# Patient Record
Sex: Female | Born: 1960 | Race: White | Hispanic: No | Marital: Married | Smoking: Never smoker
Health system: Southern US, Community
[De-identification: ages and names within clinical notes are randomized; demographics above are authoritative.]

## PROBLEM LIST (undated history)

## (undated) DIAGNOSIS — I10 Essential (primary) hypertension: Secondary | ICD-10-CM

## (undated) DIAGNOSIS — T7840XA Allergy, unspecified, initial encounter: Secondary | ICD-10-CM

## (undated) HISTORY — DX: Allergy, unspecified, initial encounter: T78.40XA

## (undated) HISTORY — DX: Essential (primary) hypertension: I10

---
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
## 1992-05-27 HISTORY — PX: NOSE SURGERY: SHX723

## 1992-05-27 HISTORY — PX: KNEE SURGERY: SHX244

## 2004-06-27 ENCOUNTER — Encounter: Payer: Self-pay | Admitting: Family Medicine

## 2005-06-11 ENCOUNTER — Ambulatory Visit: Payer: Self-pay | Admitting: Family Medicine

## 2005-12-11 ENCOUNTER — Ambulatory Visit: Payer: Self-pay | Admitting: Family Medicine

## 2006-01-07 ENCOUNTER — Ambulatory Visit: Payer: Self-pay | Admitting: Family Medicine

## 2006-07-16 ENCOUNTER — Ambulatory Visit: Payer: Self-pay | Admitting: Family Medicine

## 2006-07-16 ENCOUNTER — Encounter: Payer: Self-pay | Admitting: Family Medicine

## 2008-09-19 ENCOUNTER — Encounter: Payer: Self-pay | Admitting: Family Medicine

## 2009-04-26 ENCOUNTER — Ambulatory Visit: Payer: Self-pay | Admitting: Family Medicine

## 2009-04-26 DIAGNOSIS — I10 Essential (primary) hypertension: Secondary | ICD-10-CM | POA: Insufficient documentation

## 2009-05-04 ENCOUNTER — Encounter: Payer: Self-pay | Admitting: Family Medicine

## 2009-05-04 LAB — CONVERTED CEMR LAB
ALT: 18 units/L (ref 0–35)
AST: 17 units/L (ref 0–37)
Albumin: 4.6 g/dL (ref 3.5–5.2)
CO2: 25 meq/L (ref 19–32)
Calcium: 9.4 mg/dL (ref 8.4–10.5)
LDL Cholesterol: 172 mg/dL — ABNORMAL HIGH (ref 0–99)
Total Bilirubin: 0.6 mg/dL (ref 0.3–1.2)
Total Protein: 7.4 g/dL (ref 6.0–8.3)
Triglycerides: 134 mg/dL (ref <150)
VLDL: 27 mg/dL (ref 0–40)

## 2009-06-01 ENCOUNTER — Ambulatory Visit: Payer: Self-pay | Admitting: Family Medicine

## 2009-06-01 DIAGNOSIS — E785 Hyperlipidemia, unspecified: Secondary | ICD-10-CM | POA: Insufficient documentation

## 2009-06-26 ENCOUNTER — Telehealth: Payer: Self-pay | Admitting: Family Medicine

## 2009-07-21 ENCOUNTER — Encounter: Payer: Self-pay | Admitting: Family Medicine

## 2009-07-23 LAB — CONVERTED CEMR LAB
ALT: 13 units/L (ref 0–35)
AST: 14 units/L (ref 0–37)
CO2: 26 meq/L (ref 19–32)
Calcium: 9.3 mg/dL (ref 8.4–10.5)
Glucose, Bld: 100 mg/dL — ABNORMAL HIGH (ref 70–99)
HDL: 50 mg/dL (ref >39)
Potassium: 4.4 meq/L (ref 3.5–5.3)
Sodium: 140 meq/L (ref 135–145)
Total CHOL/HDL Ratio: 2.7
Triglycerides: 84 mg/dL (ref <150)

## 2009-08-01 ENCOUNTER — Ambulatory Visit: Payer: Self-pay | Admitting: Family Medicine

## 2009-12-12 ENCOUNTER — Telehealth: Payer: Self-pay | Admitting: Family

## 2010-01-22 ENCOUNTER — Ambulatory Visit: Payer: Self-pay | Admitting: Family Medicine

## 2010-01-22 DIAGNOSIS — I10 Essential (primary) hypertension: Secondary | ICD-10-CM | POA: Insufficient documentation

## 2010-01-22 DIAGNOSIS — S8010XA Contusion of unspecified lower leg, initial encounter: Secondary | ICD-10-CM | POA: Insufficient documentation

## 2010-01-25 ENCOUNTER — Telehealth (INDEPENDENT_AMBULATORY_CARE_PROVIDER_SITE_OTHER): Payer: Self-pay | Admitting: *Deleted

## 2010-06-28 NOTE — Assessment & Plan Note (Signed)
Summary: 2 MONTH FU HTN, lipids   Vital Signs:  Patient profile:   50 year old female Height:      67 inches Weight:      197 pounds Pulse rate:   96 / minute BP sitting:   131 / 77  (left arm) Cuff size:   regular  Vitals Entered By: Kathlene November (August 01, 2009 10:02 AM) CC: followup BP. Stopped the cholesterol med Simvastatin- due to muscle aches, Hypertension Management   Primary Care Provider:  Nani Gasser MD  CC:  followup BP. Stopped the cholesterol med Simvastatin- due to muscle aches and Hypertension Management.  History of Present Illness: followup BP. Stopped the cholesterol med Simvastatin- due to muscle aches.  Went off for a week and muscle aches got better and then restarted it and sxs returned. Was having pain in her shoulders and hips.  Also felt fatigued on it.   Hypertension History:      She denies headache, chest pain, palpitations, dyspnea with exertion, orthopnea, PND, peripheral edema, visual symptoms, neurologic problems, syncope, and side effects from treatment.  She notes no problems with any antihypertensive medication side effects.        Positive major cardiovascular risk factors include hyperlipidemia and hypertension.  Negative major cardiovascular risk factors include female age less than 38 years old and non-tobacco-user status.     Current Medications (verified): 1)  Calcium and Vitamin D .... Take One Tablet Two Times A Day 2)  Loratadine 10 Mg Tabs (Loratadine) .... Take One Tablet By Mouth Once A Day 3)  Fluticasone Propionate 50 Mcg/act Susp (Fluticasone Propionate) .Marland Kitchen.. 1-2 Sprays in Each Nostril Daily For Allergies 4)  Vitamin C .... Take One Tablet Bby Mouth Daily 5)  Lisinopril-Hydrochlorothiazide 10-12.5 Mg Tabs (Lisinopril-Hydrochlorothiazide) .... Take 1 Tablet By Mouth Once A Day  Allergies (verified): 1)  ! Simvastatin (Simvastatin)  Comments:  Nurse/Medical Assistant: The patient's medications and allergies were reviewed  with the patient and were updated in the Medication and Allergy Lists. Kathlene November (August 01, 2009 10:03 AM)  Physical Exam  General:  Well-developed,well-nourished,in no acute distress; alert,appropriate and cooperative throughout examination Head:  Normocephalic and atraumatic without obvious abnormalities. No apparent alopecia or balding. Lungs:  Normal respiratory effort, chest expands symmetrically. Lungs are clear to auscultation, no crackles or wheezes. Heart:  Normal rate and regular rhythm. S1 and S2 normal without gallop, murmur, click, rub or other extra sounds. Skin:  no rashes.   Cervical Nodes:  No lymphadenopathy noted Psych:  Cognition and judgment appear intact. Alert and cooperative with normal attention span and concentration. No apparent delusions, illusions, hallucinations   Impression & Recommendations:  Problem # 1:  HYPERTENSION, BENIGN (ICD-401.1)  Her updated medication list for this problem includes:    Lisinopril-hydrochlorothiazide 10-12.5 Mg Tabs (Lisinopril-hydrochlorothiazide) .Marland Kitchen... Take 1 tablet by mouth once a day Looks great! Doing well. F/U in 6 month.  BP today: 131/77 Prior BP: 125/84 (06/01/2009)  Prior 10 Yr Risk Heart Disease: 6 % (06/01/2009)  Labs Reviewed: K+: 4.4 (07/21/2009) Creat: : 0.70 (07/21/2009)   Chol: 134 (07/21/2009)   HDL: 50 (07/21/2009)   LDL: 67 (07/21/2009)   TG: 84 (07/21/2009)  Problem # 2:  HYPERLIPIDEMIA (ICD-272.4) Samples of 20mg  given. Asked her to cut them in half for a months. Call me if not tolerating well. REcheck level in 8 weeks on new med if tolerating well.  Will try changing her to lipitor since tends to have fewer myalgias.  Her updated medication list for this problem includes:    Lipitor 20 Mg Tabs (Atorvastatin calcium) .Marland Kitchen... Take 1 tablet by mouth once a day at bedtime  Labs Reviewed: SGOT: 14 (07/21/2009)   SGPT: 13 (07/21/2009)  Prior 10 Yr Risk Heart Disease: 6 % (06/01/2009)   HDL:50  (07/21/2009), 53 (47/82/9562)  LDL:67 (07/21/2009), 172 (13/12/6576)  Chol:134 (07/21/2009), 252 (05/04/2009)  Trig:84 (07/21/2009), 134 (05/04/2009)  Complete Medication List: 1)  Calcium and Vitamin D  .... Take one tablet two times a day 2)  Loratadine 10 Mg Tabs (Loratadine) .... Take one tablet by mouth once a day 3)  Fluticasone Propionate 50 Mcg/act Susp (Fluticasone propionate) .Marland Kitchen.. 1-2 sprays in each nostril daily for allergies 4)  Vitamin C  .... Take one tablet bby mouth daily 5)  Lisinopril-hydrochlorothiazide 10-12.5 Mg Tabs (Lisinopril-hydrochlorothiazide) .... Take 1 tablet by mouth once a day 6)  Lipitor 20 Mg Tabs (Atorvastatin calcium) .... Take 1 tablet by mouth once a day at bedtime  Hypertension Assessment/Plan:      The patient's hypertensive risk group is category B: At least one risk factor (excluding diabetes) with no target organ damage.  Her calculated 10 year risk of coronary heart disease is 3 %.  Today's blood pressure is 131/77.  Her blood pressure goal is < 140/90.

## 2010-06-28 NOTE — Progress Notes (Signed)
Summary: Stopping Lipitor  Phone Note Call from Patient Call back at Home Phone [phone]   Caller: Patient Call For: Sandford Craze Summary of Call: Pt states that on the Lipitor and having jointaches just like she did on the Simvastatin and is going to stop it and wanted you to be aware Initial call taken by: Kathlene November,  December 12, 2009 1:33 PM  Follow-up for Phone Call        [facility] to stop lipitor, but pt needs to arrange follow up with Dr. Linford Arnold in the next month. Follow-up by: Lemont Fillers FNP,  December 12, 2009 4:44 PM  Additional Follow-up for Phone Call Additional follow up Details #1::        Pt notified of results and MD instructions. KJ LPN Additional Follow-up by: Kathlene November,  December 12, 2009 4:56 PM

## 2010-06-28 NOTE — Progress Notes (Signed)
  Phone Note Outgoing Call   Call placed by: Lajean Saver RN,  January 25, 2010 1:58 PM Call placed to: Patient Action Taken: Phone Call Completed Summary of Call: Call back: Patient says her ankle is improving. She did not have any further questions and was told to call uswith any questions or concerns.

## 2010-06-28 NOTE — Progress Notes (Signed)
Summary: joint aches  Phone Note Call from Patient Call back at [facility] Phone [phone]   Caller: Patient Summary of Call: Put on Simvastatin and Lisinopril. having joint pains . Will watch this over next few days Initial call taken by: Kathlene November,  June 26, 2009 11:49 AM  Follow-up for Phone Call        Hold the simvastain for one week and see if better.  IF not let me know.  If is better then lets retry the simvastating after a weeka dn see if starts again. If does then will need to change her statin dose.  Follow-up by: Nani Gasser MD,  June 26, 2009 12:00 PM  Additional Follow-up for Phone Call Additional follow up Details #1::        Pt notified of MD instructions. KJ LPN Additional Follow-up by: Kathlene November,  June 26, 2009 12:42 PM

## 2010-06-28 NOTE — Assessment & Plan Note (Signed)
Summary: LEFT ANKLE PAIN (4)   Vital Signs:  Patient Profile:   50 Years Old Female CC:       left ankle pain post injury 2 weeks ago Height:     67 inches (170.18 cm) Weight:      204 pounds O2 Sat:      100 % O2 treatment:    Room Air Temp:     98.5 degrees F oral Pulse rate:   74 / minute Resp:     12 per minute BP sitting:   128 / 85  (left arm) Cuff size:   regular  Pt. in pain?   yes    Location:   left ankle    Intensity:   5    Type:       sharp  Vitals Entered By: Lajean Saver RN (January 22, 2010 9:29 AM)                   Updated Prior Medication List: * CALCIUM AND VITAMIN D take one tablet two times a day FLUTICASONE PROPIONATE 50 MCG/ACT SUSP (FLUTICASONE PROPIONATE) 1-2 sprays in each nostril daily for allergies * VITAMIN C take one tablet bby mouth daily LISINOPRIL-HYDROCHLOROTHIAZIDE 10-12.5 MG TABS (LISINOPRIL-HYDROCHLOROTHIAZIDE) Take 1 tablet by mouth once a day  Current Allergies (reviewed today): ! SIMVASTATIN (SIMVASTATIN)History of Present Illness Chief Complaint:  left ankle pain post injury 2 weeks ago History of Present Illness:  Subjective:  Patient complains of softball hitting left lower leg two weeks ago.  Although improving, still has tenderness and mild swelling left lower leg.  REVIEW OF SYSTEMS Constitutional Symptoms      Denies fever, chills, night sweats, weight loss, weight gain, and fatigue.  Eyes       Denies change in vision, eye pain, eye discharge, glasses, contact lenses, and eye surgery. Ear/Nose/Throat/Mouth       Denies hearing loss/aids, change in hearing, ear pain, ear discharge, dizziness, frequent runny nose, frequent nose bleeds, sinus problems, sore throat, hoarseness, and tooth pain or bleeding.  Respiratory       Denies dry cough, productive cough, wheezing, shortness of breath, asthma, bronchitis, and emphysema/COPD.  Cardiovascular       Denies murmurs, chest pain, and tires easily with exhertion.     Gastrointestinal       Denies stomach pain, nausea/vomiting, diarrhea, constipation, blood in bowel movements, and indigestion. Genitourniary       Denies painful urination, kidney stones, and loss of urinary control. Neurological       Complains of numbness.      Denies paralysis, seizures, and fainting/blackouts.      Comments: left ankle Musculoskeletal       Complains of joint pain, joint stiffness, and swelling.      Denies muscle pain, decreased range of motion, redness, muscle weakness, and gout.      Comments: left ankle Skin       Denies bruising, unusual mles/lumps or sores, and hair/skin or nail changes.  Psych       Denies mood changes, temper/anger issues, anxiety/stress, speech problems, depression, and sleep problems. Other Comments: Patient was hit by a soft ball 2 weeks ago. She c/o numbness and pain with activity in her left ankle.    Past History:  Past Medical History: G2 P2, Seasonal allergies Hypertension  Past Surgical History: Reviewed history from 03/04/2006 and no changes required. c/sec  12/87, 12/89, knee sx, right  1994, Nose surgery 1994, oral sx  1982  Family History: Reviewed history from 04/26/2009 and no changes required. DM-mother, high chol, HTN GB dz-sister,   skin Ca-sister, stroke-dad Father - Parkinson's syndrome, HTN deceased.   Social History: Reviewed history from 03/04/2006 and no changes required. Psychologist, sport and exercise for [employer].  BA degree.  Married to Mandan with 2 children.  Never smoked, 1-3EtOH pe week, no drugs, 2-3caffeinated drinks per day, works out 1-1 1/2 hr 3-4day/wk.   Objective:  No acute distress  Left lower leg:  Mild swelling and point tenderness lower medial pre-tibial area.  Ankle has full range of motion.  No posterior calf tenderness.  Distal neurovascular intact  X-ray left tib/fib:  negative. Assessment New Problems: CONTUSION, LOWER LEG, LEFT (ICD-924.10) HYPERTENSION (ICD-401.9)   Plan New  Orders: T-DG Tibia/Fibula*L* [73590] New Patient Level III [99203] Planning Comments:   Recommend graduated support hose daytime until healed.  Begin applying heating pad once or twice daily.  Elevate leg when possible. Follow-up with PCP for increasing pain or swelling.   The patient and/or caregiver has been counseled thoroughly with regard to medications prescribed including dosage, schedule, interactions, rationale for use, and possible side effects and they verbalize understanding.  Diagnoses and expected course of recovery discussed and will return if not improved as expected or if the condition worsens. Patient and/or caregiver verbalized understanding.   Orders Added: 1)  T-DG Tibia/Fibula*L* [73590] 2)  New Patient Level III [16109]

## 2010-06-28 NOTE — Assessment & Plan Note (Signed)
Summary: HTN, lipids   Vital Signs:  Patient profile:   50 year old female Height:      67 inches Weight:      197 pounds Pulse rate:   86 / minute BP sitting:   125 / 84  (left arm) Cuff size:   regular  Vitals Entered By: Kathlene November (June 01, 2009 9:10 AM) CC: follow-up BP, Hypertension Management   CC:  follow-up BP and Hypertension Management.  Hypertension History:      Has lost 5 pounds. Did have a couple of dizzy spells when first started the medication. Since then has been fine. Came off birth control pills. Still not exercisign but has lost weight. .        Positive major cardiovascular risk factors include hyperlipidemia and hypertension.  Negative major cardiovascular risk factors include female age less than 51 years old and non-tobacco-user status.     Current Medications (verified): 1)  Calcium and Vitamin D .... Take One Tablet Two Times A Day 2)  Loratadine 10 Mg Tabs (Loratadine) .... Take One Tablet By Mouth Once A Day 3)  Fluticasone Propionate 50 Mcg/act Susp (Fluticasone Propionate) .Marland Kitchen.. 1-2 Sprays in Each Nostril Daily For Allergies 4)  Vitamin C .... Take One Tablet Bby Mouth Daily 5)  Lisinopril-Hydrochlorothiazide 10-12.5 Mg Tabs (Lisinopril-Hydrochlorothiazide) .... Take 1 Tablet By Mouth Once A Day 6)  Simvastatin 40 Mg Tabs (Simvastatin) .... Take 1 Tablet By Mouth Once A Day At Bedtime  Allergies (verified): No Known Drug Allergies  Comments:  Nurse/Medical Assistant: The patient's medications and allergies were reviewed with the patient and were updated in the Medication and Allergy Lists. Kathlene November (June 01, 2009 9:11 AM)  Physical Exam  General:  Well-developed,well-nourished,in no acute distress; alert,appropriate and cooperative throughout examination Lungs:  Normal respiratory effort, chest expands symmetrically. Lungs are clear to auscultation, no crackles or wheezes. Heart:  Normal rate and regular rhythm. S1 and S2 normal  without gallop, murmur, click, rub or other extra sounds.  No carotid bruits.  Skin:  no rashes.   Psych:  Cognition and judgment appear intact. Alert and cooperative with normal attention span and concentration. No apparent delusions, illusions, hallucinations   Impression & Recommendations:  Problem # 1:  HYPERTENSION, BENIGN (ICD-401.1) Doing great!!!! At [facility]. FU in 2 months to recheck. She is doing such a great job on weight loss. Still encourage regular exercise. She would love to come off medication and I think this is a great goal.   Her updated medication list for this problem includes:    Lisinopril-hydrochlorothiazide 10-12.5 Mg Tabs (Lisinopril-hydrochlorothiazide) .Marland Kitchen... Take 1 tablet by mouth once a day  Problem # 2:  HYPERLIPIDEMIA (ICD-272.4)  Had some stomach upset intially but better. Due to recheck later this month. Lab slip given today. Continue tto work on diet adn weight. Gave her a copy of her previsou labwork and reviewed this with her and her goals.  Her updated medication list for this problem includes:    Simvastatin 40 Mg Tabs (Simvastatin) .Marland Kitchen... Take 1 tablet by mouth once a day at bedtime  Orders: T-Lipid Profile ([id number]) T-Comprehensive Metabolic Panel ([id number])  Complete Medication List: 1)  Calcium and Vitamin D  .... Take one tablet two times a day 2)  Loratadine 10 Mg Tabs (Loratadine) .... Take one tablet by mouth once a day 3)  Fluticasone Propionate 50 Mcg/act Susp (Fluticasone propionate) .Marland Kitchen.. 1-2 sprays in each nostril daily for allergies 4)  Vitamin C  .Marland KitchenMarland KitchenMarland Kitchen  Take one tablet bby mouth daily 5)  Lisinopril-hydrochlorothiazide 10-12.5 Mg Tabs (Lisinopril-hydrochlorothiazide) .... Take 1 tablet by mouth once a day 6)  Simvastatin 40 Mg Tabs (Simvastatin) .... Take 1 tablet by mouth once a day at bedtime  Hypertension Assessment/Plan:      The patient's hypertensive risk group is category B: At least one risk factor (excluding diabetes) with  no target organ damage.  Her calculated 10 year risk of coronary heart disease is 6 %.  Today's blood pressure is 125/84.  Her blood pressure goal is < 140/90.  Flu Vaccine Next Due:  Not Indicated

## 2010-08-22 ENCOUNTER — Other Ambulatory Visit: Payer: Self-pay | Admitting: Family Medicine

## 2010-08-23 ENCOUNTER — Other Ambulatory Visit: Payer: Self-pay | Admitting: *Deleted

## 2010-08-23 MED ORDER — LISINOPRIL-HYDROCHLOROTHIAZIDE 10-12.5 MG PO TABS: 1.0000 | ORAL_TABLET | Freq: Every day | ORAL | Status: DC

## 2010-08-26 ENCOUNTER — Encounter: Payer: Self-pay | Admitting: Family Medicine

## 2010-08-30 ENCOUNTER — Ambulatory Visit
Admission: RE | Admit: 2010-08-30 | Discharge: 2010-08-30 | Disposition: A | Discharge status: Home / Self Care | Payer: BC Managed Care – PPO | Source: Ambulatory Visit | Attending: Sports Medicine | Admitting: Sports Medicine

## 2010-08-30 ENCOUNTER — Other Ambulatory Visit: Payer: Self-pay | Admitting: Sports Medicine

## 2010-08-30 ENCOUNTER — Ambulatory Visit (INDEPENDENT_AMBULATORY_CARE_PROVIDER_SITE_OTHER): Payer: BC Managed Care – PPO | Admitting: Family Medicine

## 2010-08-30 ENCOUNTER — Encounter: Payer: Self-pay | Admitting: Family Medicine

## 2010-08-30 VITALS — BP 102/70 | HR 98 | Ht 68.75 in | Wt 209.0 lb

## 2010-08-30 DIAGNOSIS — I10 Essential (primary) hypertension: Secondary | ICD-10-CM

## 2010-08-30 DIAGNOSIS — M25522 Pain in left elbow: Secondary | ICD-10-CM

## 2010-08-30 LAB — LIPID PANEL
Cholesterol: 197 mg/dL (ref 0–200)
HDL: 50 mg/dL (ref >39)
Total CHOL/HDL Ratio: 3.9 Ratio
VLDL: 26 mg/dL (ref 0–40)

## 2010-08-30 MED ORDER — HYDROCHLOROTHIAZIDE 12.5 MG PO CAPS: 12.5000 mg | ORAL_CAPSULE | Freq: Every day | ORAL | Status: DC

## 2010-08-30 NOTE — Progress Notes (Signed)
  Subjective:    Patient ID: Nicole Braun, female    DOB: January 27, 1961, 50 y.o.   MRN: [id number]  Hypertension This is a chronic problem. The problem has been gradually improving since onset. The problem is controlled. Pertinent negatives include no chest pain or shortness of breath. There are no associated agents to hypertension. Risk factors for coronary artery disease include no known risk factors. Past treatments include nothing. The current treatment provides significant improvement. There are no compliance problems.   She says she is eating more healthy and being more active in general. Has really been trying to wALK  For exercise on the weekends.     Review of Systems  Respiratory: Negative for shortness of breath.   Cardiovascular: Negative for chest pain.       Objective:   Physical Exam  Constitutional: She appears well-developed and well-nourished.  HENT:  Head: Normocephalic and atraumatic.  Eyes: Conjunctivae are normal. Pupils are equal, round, and reactive to light.  Cardiovascular: Normal rate, regular rhythm and normal heart sounds.   Pulmonary/Chest: Effort normal and breath sounds normal.  Musculoskeletal: She exhibits no edema.  Skin: Skin is warm and dry.  Psychiatric: She has a normal mood and affect.          Assessment & Plan:

## 2010-08-30 NOTE — Assessment & Plan Note (Signed)
Doing so well will d/c the ACE and continue the diuretic. F/u in 1-2 months and hopefully can discontinue the meds completely. Cointinue her regular exercise and continue to work on diet.

## 2010-08-31 LAB — COMPLETE METABOLIC PANEL WITH GFR
AST: 15 U/L (ref 0–37)
Alkaline Phosphatase: 73 U/L (ref 39–117)
BUN: 13 mg/dL (ref 6–23)
GFR, Est Non African American: >60 mL/min (ref >60)
Glucose, Bld: 99 mg/dL (ref 70–99)
Potassium: 4.2 mEq/L (ref 3.5–5.3)
Sodium: 139 mEq/L (ref 135–145)
Total Bilirubin: 0.5 mg/dL (ref 0.3–1.2)
Total Protein: 6.8 g/dL (ref 6.0–8.3)

## 2010-09-03 ENCOUNTER — Telehealth: Payer: Self-pay | Admitting: Family Medicine

## 2010-09-03 NOTE — Telephone Encounter (Signed)
Call pt: LABs are ok.  LDL chol is higher than when was on the statin, but is much much better than it was a year ago.  Keep up the good work and will hold off on restarting a cholesterol pill. Recheck in 6 months.

## 2010-09-04 NOTE — Telephone Encounter (Signed)
Left message on pt.'s vm.

## 2010-09-10 ENCOUNTER — Ambulatory Visit: Payer: BC Managed Care – PPO | Attending: Sports Medicine | Admitting: Physical Therapy

## 2010-09-10 DIAGNOSIS — M25539 Pain in unspecified wrist: Secondary | ICD-10-CM | POA: Insufficient documentation

## 2010-09-10 DIAGNOSIS — IMO0001 Reserved for inherently not codable concepts without codable children: Secondary | ICD-10-CM | POA: Insufficient documentation

## 2010-09-19 ENCOUNTER — Ambulatory Visit: Payer: BC Managed Care – PPO | Admitting: Physical Therapy

## 2010-09-24 ENCOUNTER — Ambulatory Visit: Payer: BC Managed Care – PPO | Attending: Sports Medicine | Admitting: Physical Therapy

## 2010-09-24 DIAGNOSIS — IMO0001 Reserved for inherently not codable concepts without codable children: Secondary | ICD-10-CM | POA: Insufficient documentation

## 2010-09-24 DIAGNOSIS — M25539 Pain in unspecified wrist: Secondary | ICD-10-CM | POA: Insufficient documentation

## 2010-10-03 ENCOUNTER — Ambulatory Visit: Payer: BC Managed Care – PPO | Attending: Sports Medicine | Admitting: Physical Therapy

## 2010-10-03 DIAGNOSIS — IMO0001 Reserved for inherently not codable concepts without codable children: Secondary | ICD-10-CM | POA: Insufficient documentation

## 2010-10-03 DIAGNOSIS — M25539 Pain in unspecified wrist: Secondary | ICD-10-CM | POA: Insufficient documentation

## 2010-10-10 ENCOUNTER — Ambulatory Visit: Payer: BC Managed Care – PPO | Admitting: Physical Therapy

## 2010-10-17 ENCOUNTER — Ambulatory Visit: Payer: BC Managed Care – PPO | Admitting: Physical Therapy

## 2011-05-23 ENCOUNTER — Encounter: Payer: Self-pay | Admitting: Physician Assistant

## 2011-05-23 ENCOUNTER — Ambulatory Visit (INDEPENDENT_AMBULATORY_CARE_PROVIDER_SITE_OTHER): Payer: BC Managed Care – PPO | Admitting: Physician Assistant

## 2011-05-23 VITALS — BP 150/90 | HR 80 | Temp 98.3°F | Ht 68.0 in | Wt 220.0 lb

## 2011-05-23 DIAGNOSIS — M25562 Pain in left knee: Secondary | ICD-10-CM

## 2011-05-23 DIAGNOSIS — M25569 Pain in unspecified knee: Secondary | ICD-10-CM

## 2011-05-23 DIAGNOSIS — I10 Essential (primary) hypertension: Secondary | ICD-10-CM

## 2011-05-23 MED ORDER — LISINOPRIL-HYDROCHLOROTHIAZIDE 10-12.5 MG PO TABS: 1.0000 | ORAL_TABLET | Freq: Every day | ORAL | Status: DC

## 2011-05-23 NOTE — Progress Notes (Signed)
  Subjective:    Patient ID: Nicole Braun, female    DOB: 03-23-1961, 50 y.o.   MRN: [id number]  HPI Patient reports that knee pain began first of November when she got out of her bed she noticed her left knee was hurting her. It has continually gotten worse over the course of 2 months. The pain is the worse first thing in the morning and at night. She cannot exercise. It is hard to walk. She has trouble getting in and out of the car. It is hard to bend over or put a lot of weight on it. Left knee is swollen and hot. She does not have history of gout. She did have arthoscopic surgery on it at least 15 years ago she is not aware of what the surgery was for. She uses Aleve 2 times a day and helps some. She has tried heat and it relieves some of the pain. She has an appt with an orthopedic doctor on January 8th.  Not had any diagnostic test done.   Review of Systems  Constitutional: Negative for fever.  Cardiovascular: Negative for leg swelling.  Musculoskeletal: Positive for myalgias, joint swelling, arthralgias and gait problem.       Patient reports left knee pain and recently her calves and thighs ache. She is unable to walk with normal gait due to pain. She puts all of her weight on right leg.       Objective:   Physical Exam  Constitutional: She appears well-developed and well-nourished.  HENT:  Head: Normocephalic and atraumatic.  Cardiovascular: Normal rate, regular rhythm and normal heart sounds.   Pulmonary/Chest: Effort normal and breath sounds normal.  Musculoskeletal: She exhibits edema and tenderness.       Right knee: Normal.       Left knee: She exhibits decreased range of motion, swelling and erythema. She exhibits normal alignment, no LCL laxity, normal patellar mobility, no bony tenderness and no MCL laxity. tenderness found. Medial joint line and lateral joint line tenderness noted.       Legs:      ROM of Right knee 0 to 180. ROM of Left knee -10 to 115.(decreased)   Psychiatric: She has a normal mood and affect. Her behavior is normal.          Assessment & Plan:  Left Knee pain: Patient was instructed to increase aleve to 3 times a day. Start using cold compress, rest, and elevation to help to decrease swelling.Educated that ace bandage could help bring stability to knee if used. Instructed to keep ortho appt for January.  HTN: Blood pressure was elevated today. Previously she was taken off of her BP medications. Today with a BP of 150/90 she was advised to go back on Lisinopril/HCT. She has also gained 11 lbs and taking NSAIDS. She was instructed to come back in 2 weeks for a nurse visit blood pressure check.

## 2011-05-23 NOTE — Patient Instructions (Signed)
Follow up on Blood Pressure in 2 weeks nurse visit. Instructed patient to increase to aleve 3 times a day. Cold applications to knee may help swelling and pain. Ace bandage on knee to help with stablization.

## 2011-06-06 ENCOUNTER — Ambulatory Visit (INDEPENDENT_AMBULATORY_CARE_PROVIDER_SITE_OTHER): Payer: BC Managed Care – PPO | Admitting: Family Medicine

## 2011-06-06 DIAGNOSIS — I10 Essential (primary) hypertension: Secondary | ICD-10-CM

## 2011-06-06 NOTE — Progress Notes (Signed)
Patient ID: Nicole Braun, female   DOB: [birth year], 51 y.o.   MRN: [id number] Verbal correction by Dr. Linford Arnold- Pt to leave BP med as is.

## 2011-06-06 NOTE — Progress Notes (Signed)
  Subjective:    Patient ID: Nicole Braun, female    DOB: 1960-12-09, 51 y.o.   MRN: [id number] BP check: Pt states she is on Prednisone x4 more days then will be taking Celebrex x6 days per ortho. Pt would like to stay on BP meds until after this. HPI    Review of Systems     Objective:   Physical Exam        Assessment & Plan:  HTN- Increase bp meds to one in AM and one in PM and then recheck BP in 2 weeks in our office.

## 2011-12-18 ENCOUNTER — Telehealth: Payer: Self-pay | Admitting: Family Medicine

## 2011-12-18 NOTE — Telephone Encounter (Signed)
Please call patient. Normal mammogram.  Repeat in 1 year.  

## 2011-12-18 NOTE — Telephone Encounter (Signed)
Left message on vm with results  

## 2011-12-19 ENCOUNTER — Encounter: Payer: Self-pay | Admitting: *Deleted

## 2013-01-29 ENCOUNTER — Ambulatory Visit: Payer: BC Managed Care – PPO | Admitting: Family Medicine

## 2013-05-12 LAB — HM COLONOSCOPY

## 2013-12-07 ENCOUNTER — Ambulatory Visit (INDEPENDENT_AMBULATORY_CARE_PROVIDER_SITE_OTHER): Payer: BC Managed Care – PPO | Admitting: Family Medicine

## 2013-12-07 ENCOUNTER — Encounter: Payer: Self-pay | Admitting: Family Medicine

## 2013-12-07 VITALS — BP 143/84 | HR 82 | Ht 68.0 in | Wt 222.0 lb

## 2013-12-07 DIAGNOSIS — E785 Hyperlipidemia, unspecified: Secondary | ICD-10-CM

## 2013-12-07 DIAGNOSIS — E669 Obesity, unspecified: Secondary | ICD-10-CM

## 2013-12-07 DIAGNOSIS — I10 Essential (primary) hypertension: Secondary | ICD-10-CM

## 2013-12-07 MED ORDER — LISINOPRIL-HYDROCHLOROTHIAZIDE 10-12.5 MG PO TABS: 1.0000 | ORAL_TABLET | Freq: Every day | ORAL | Status: DC

## 2013-12-07 NOTE — Patient Instructions (Signed)
[employer]

## 2013-12-07 NOTE — Progress Notes (Signed)
   Subjective:    Patient ID: Nicole Braun, female    DOB: November 05, 1960, 53 y.o.   MRN: [id number]  HPI Says had a couple of dizzy episodes and says her BP has been up . Says when dizzy she can tell it is her BP up.  Started her old lisinopril and it helped.  Home BPs running 170-180/90 at home.  Also went to [facility] to check it and it was 190/100.  Has had a lot of stress in her life.  No CP or SOB or palpitations.  No history of thyroid problems. She has gained some weight over the last couple years. She's not exercising regularly.   Review of Systems     Objective:   Physical Exam  Constitutional: She is oriented to person, place, and time. She appears well-developed and well-nourished.  HENT:  Head: Normocephalic and atraumatic.  Neck: Neck supple. No thyromegaly present.  Cardiovascular: Normal rate, regular rhythm and normal heart sounds.   Pulmonary/Chest: Effort normal and breath sounds normal.  Lymphadenopathy:    She has no cervical adenopathy.  Neurological: She is alert and oriented to person, place, and time.  Skin: Skin is warm and dry.  Psychiatric: She has a normal mood and affect. Her behavior is normal.          Assessment & Plan:  HTN -  Uncontrolled. We will restart the lisinopril HCTZ. She's felt much better since taking her old prescription. I will see her back in one month. Encouraged her to bring in her home blood pressure cuff to compare to ours. Discussed the importance of low-salt diet call if any palms or side effects of the medication. We will check a BMP at followup visit when she restart the medication. She is due for fasting lipid panel and will check her thyroid as well. Lab slip provided today. Check TSH  Obesity/BMI 33 - discussed the importance of weight loss and controlling her blood pressure. Discussed the Smart phone application called [employer].  workon diet and exercise to help control BP.   Hyperlipidemia - due to recheck levels. Not  on meds for this.

## 2013-12-17 LAB — LIPID PANEL
CHOLESTEROL: 225 mg/dL — AB (ref 0–200)
HDL: 48 mg/dL (ref >39)
LDL Cholesterol: 137 mg/dL — ABNORMAL HIGH (ref 0–99)
Total CHOL/HDL Ratio: 4.7 Ratio
Triglycerides: 202 mg/dL — ABNORMAL HIGH (ref <150)
VLDL: 40 mg/dL (ref 0–40)

## 2013-12-17 LAB — COMPLETE METABOLIC PANEL WITH GFR
ALBUMIN: 4.4 g/dL (ref 3.5–5.2)
ALT: 17 U/L (ref 0–35)
AST: 17 U/L (ref 0–37)
Alkaline Phosphatase: 83 U/L (ref 39–117)
BUN: 18 mg/dL (ref 6–23)
CALCIUM: 9.2 mg/dL (ref 8.4–10.5)
CHLORIDE: 102 meq/L (ref 96–112)
CO2: 28 meq/L (ref 19–32)
Creat: 0.64 mg/dL (ref 0.50–1.10)
GFR, Est Non African American: >89 mL/min
Glucose, Bld: 96 mg/dL (ref 70–99)
POTASSIUM: 4.7 meq/L (ref 3.5–5.3)
SODIUM: 139 meq/L (ref 135–145)
TOTAL PROTEIN: 7.2 g/dL (ref 6.0–8.3)
Total Bilirubin: 0.5 mg/dL (ref 0.2–1.2)

## 2013-12-18 LAB — TSH: TSH: 2.036 u[IU]/mL (ref 0.350–4.500)

## 2014-02-01 ENCOUNTER — Encounter: Payer: Self-pay | Admitting: Family Medicine

## 2014-06-06 ENCOUNTER — Other Ambulatory Visit: Payer: Self-pay | Admitting: Family Medicine

## 2014-08-04 ENCOUNTER — Other Ambulatory Visit: Payer: Self-pay | Admitting: Family Medicine

## 2014-08-23 ENCOUNTER — Other Ambulatory Visit: Payer: Self-pay | Admitting: Family Medicine

## 2014-08-24 NOTE — Telephone Encounter (Signed)
Pt set for f/u appt. Pt must keep this appt for future refills.

## 2014-09-21 ENCOUNTER — Telehealth: Payer: Self-pay | Admitting: Family Medicine

## 2014-09-21 ENCOUNTER — Ambulatory Visit (INDEPENDENT_AMBULATORY_CARE_PROVIDER_SITE_OTHER): Payer: BLUE CROSS/BLUE SHIELD | Admitting: Family Medicine

## 2014-09-21 ENCOUNTER — Encounter: Payer: Self-pay | Admitting: Family Medicine

## 2014-09-21 VITALS — BP 129/76 | HR 61 | Ht 68.0 in | Wt 193.0 lb

## 2014-09-21 DIAGNOSIS — I10 Essential (primary) hypertension: Secondary | ICD-10-CM

## 2014-09-21 DIAGNOSIS — Z1159 Encounter for screening for other viral diseases: Secondary | ICD-10-CM

## 2014-09-21 DIAGNOSIS — E785 Hyperlipidemia, unspecified: Secondary | ICD-10-CM

## 2014-09-21 LAB — BASIC METABOLIC PANEL WITH GFR
BUN: 19 mg/dL (ref 6–23)
CO2: 29 mEq/L (ref 19–32)
Calcium: 9.4 mg/dL (ref 8.4–10.5)
Chloride: 100 mEq/L (ref 96–112)
Creat: 0.55 mg/dL (ref 0.50–1.10)
GFR, Est African American: >89 mL/min
GFR, Est Non African American: >89 mL/min
GLUCOSE: 96 mg/dL (ref 70–99)
Potassium: 4 mEq/L (ref 3.5–5.3)
Sodium: 137 mEq/L (ref 135–145)

## 2014-09-21 NOTE — Progress Notes (Signed)
   Subjective:    Patient ID: ALDINE CHAKRABORTY, female    DOB: 1960/08/21, 54 y.o.   MRN: [id number]  HPI Hypertension- Pt denies chest pain, SOB, dizziness, or heart palpitations.  Taking meds as directed w/o problems.  Denies medication side effects.    Hyperlipidemia - not on medication. Intolerance to simvastatin.   Review of Systems     Objective:   Physical Exam  Constitutional: She is oriented to person, place, and time. She appears well-developed and well-nourished.  HENT:  Head: Normocephalic and atraumatic.  Neck: Neck supple. No thyromegaly present.  Cardiovascular: Normal rate, regular rhythm and normal heart sounds.   Pulmonary/Chest: Effort normal and breath sounds normal.  Lymphadenopathy:    She has no cervical adenopathy.  Neurological: She is alert and oriented to person, place, and time.  Skin: Skin is warm and dry.  Psychiatric: She has a normal mood and affect. Her behavior is normal.          Assessment & Plan:  HTN- well controlled. F/U in 6 months.  Due CMP, lipids  Hyperlipidemia - not on medication.  Due to recheck in July.   [facility] - Dr. Shana Chute. Will call to get colonoscopy.

## 2014-09-21 NOTE — Telephone Encounter (Signed)
Please call to get colonsocopy report: [facility] - Dr. Shana Chute. Will call to get colonoscopy.

## 2014-09-22 LAB — HEPATITIS C ANTIBODY: HCV AB: NEGATIVE

## 2014-09-23 NOTE — Telephone Encounter (Signed)
Called to get colonoscopy report told to fax medical release to [phone]

## 2014-09-23 NOTE — Telephone Encounter (Signed)
Sent fax informing med. rec that we received this pt's consultation report that was done in 03/2013 and that we now need her colonoscopy report to be sent.Nicole Braun

## 2014-09-26 ENCOUNTER — Other Ambulatory Visit: Payer: Self-pay | Admitting: Family Medicine

## 2015-03-13 LAB — HM MAMMOGRAPHY

## 2015-03-22 ENCOUNTER — Encounter: Payer: Self-pay | Admitting: Family Medicine

## 2015-03-22 ENCOUNTER — Ambulatory Visit (INDEPENDENT_AMBULATORY_CARE_PROVIDER_SITE_OTHER): Payer: BLUE CROSS/BLUE SHIELD | Admitting: Family Medicine

## 2015-03-22 VITALS — BP 124/79 | HR 63 | Temp 98.3°F | Resp 18 | Wt 206.0 lb

## 2015-03-22 DIAGNOSIS — E785 Hyperlipidemia, unspecified: Secondary | ICD-10-CM | POA: Diagnosis not present

## 2015-03-22 DIAGNOSIS — I10 Essential (primary) hypertension: Secondary | ICD-10-CM

## 2015-03-22 DIAGNOSIS — R05 Cough: Secondary | ICD-10-CM | POA: Diagnosis not present

## 2015-03-22 DIAGNOSIS — J309 Allergic rhinitis, unspecified: Secondary | ICD-10-CM

## 2015-03-22 DIAGNOSIS — R058 Other specified cough: Secondary | ICD-10-CM

## 2015-03-22 MED ORDER — BENZONATATE 200 MG PO CAPS: 200.0000 mg | ORAL_CAPSULE | Freq: Three times a day (TID) | ORAL | Status: DC | PRN

## 2015-03-22 MED ORDER — FLUTICASONE PROPIONATE 50 MCG/ACT NA SUSP: 1.0000 | Freq: Every day | NASAL | Status: DC

## 2015-03-22 NOTE — Progress Notes (Signed)
Subjective:    Patient ID: Nicole Braun, female    DOB: Jul 10, 1960, 54 y.o.   MRN: [id number]  HPI Has had a cough for about 1.5 months. Says started with a "chest cold" that was productive and then  progressed to her sinuses. Says that got better but the cough  Stayed. It is a more dry tickling cough.  Say snot as bad as it was.  Has been suing vitamin C and cough drops.  Says it wakes her up at night coughing.  Will get hoarse by the end of th eday  NO SOB. N.  No hx of wheezing. No hx of asthma.  Started using tessalone perles lasat 3 nights and that helped.     Hypertension- Pt denies chest pain, SOB, dizziness, or heart palpitations.  Taking meds as directed w/o problems.  Denies medication side effects.    Getting a lot of itching in her throat. Says she uses her claritin daily and year round.   Review of Systems  BP 124/79 mmHg  Pulse 63  Temp(Src) 98.3 F (36.8 C) (Oral)  Resp 18  Wt 206 lb (93.441 kg)  SpO2 96%    Allergies  Allergen Reactions  . Codeine     Upset stomach  . Lidocaine     Makes feel shakey  . Simvastatin     REACTION: muscle aches.    Past Medical History  Diagnosis Date  . Allergy     seasonal  . Hypertension     Past Surgical History  Procedure Laterality Date  . Cesarean section  12/87 and 12/89  . Knee surgery  1994    right  . Nose surgery  1994  . Oral sx  1982    Social History   Social History  . Marital Status: Single    Spouse Name: N/A  . Number of Children: N/A  . Years of Education: N/A   Occupational History  . Not on file.   Social History Main Topics  . Smoking status: Never Smoker   . Smokeless tobacco: Not on file  . Alcohol Use: Yes     Comment: 1-3 per week  . Drug Use: No  . Sexual Activity: Not on file   Other Topics Concern  . Not on file   Social History Narrative    Family History  Problem Relation Age of Onset  . Diabetes Mother   . Hyperlipidemia Mother   . Hypertension Mother    . Stroke Father   . Parkinsonism Father   . Hypertension Father   . Cancer Sister     skin    Outpatient Encounter Prescriptions as of 03/22/2015  Medication Sig  . lisinopril-hydrochlorothiazide (PRINZIDE,ZESTORETIC) 10-12.5 MG per tablet TAKE ONE TABLET DAILY. PT NEED OFFICE VISIT BEFORE ANY MORE REFILLS  . loratadine (CLARITIN) 10 MG tablet Take 10 mg by mouth daily.    . benzonatate (TESSALON) 200 MG capsule Take 1 capsule (200 mg total) by mouth 3 (three) times daily as needed for cough.  . fluticasone (FLONASE) 50 MCG/ACT nasal spray Place 1-2 sprays into both nostrils daily.   No facility-administered encounter medications on file as of 03/22/2015.          Objective:   Physical Exam  Constitutional: She is oriented to person, place, and time. She appears well-developed and well-nourished.  HENT:  Head: Normocephalic and atraumatic.  Right Ear: External ear normal.  Left Ear: External ear normal.  Nose: Nose normal.  Mouth/Throat:  Oropharynx is clear and moist.  TMs and canals are clear.   Eyes: Conjunctivae and EOM are normal. Pupils are equal, round, and reactive to light.  Neck: Neck supple. No thyromegaly present.  Cardiovascular: Normal rate, regular rhythm and normal heart sounds.   Pulmonary/Chest: Effort normal and breath sounds normal. She has no wheezes.  Lymphadenopathy:    She has no cervical adenopathy.  Neurological: She is alert and oriented to person, place, and time.  Skin: Skin is warm and dry.  Psychiatric: She has a normal mood and affect.          Assessment & Plan:  HTN - well controlled. Continue current regimen. Follow-up in 6 months. Due for CMP and lipid panel. Lab slip provided.  AR - discussed adding a nasal steroid spray back. She was on Flonase at one point it was helpful. I think it might work well to add this back to the fall to her Claritin better control her allergy symptoms.  Post infectious cough -gave reassurance.  Discussed how postinfectious cough can last up to 8 weeks. I don't see anything on exam history etc. that's worrisome for pneumonia. Will write a perception for Tessalon Perles that she can use to help suppress the cough. And if she's not improving over the next couple weeks and please give [place] a call back.

## 2015-03-24 ENCOUNTER — Other Ambulatory Visit: Payer: Self-pay | Admitting: Family Medicine

## 2015-06-22 ENCOUNTER — Encounter: Payer: Self-pay | Admitting: Family Medicine

## 2015-06-22 ENCOUNTER — Ambulatory Visit (INDEPENDENT_AMBULATORY_CARE_PROVIDER_SITE_OTHER): Payer: BLUE CROSS/BLUE SHIELD | Admitting: Family Medicine

## 2015-06-22 VITALS — BP 137/85 | HR 79 | Temp 98.2°F | Wt 209.0 lb

## 2015-06-22 DIAGNOSIS — B029 Zoster without complications: Secondary | ICD-10-CM

## 2015-06-22 HISTORY — DX: Zoster without complications: B02.9

## 2015-06-22 MED ORDER — VALACYCLOVIR HCL 1 G PO TABS: 1000.0000 mg | ORAL_TABLET | Freq: Three times a day (TID) | ORAL | Status: DC

## 2015-06-22 MED ORDER — GABAPENTIN 100 MG PO CAPS: 100.0000 mg | ORAL_CAPSULE | Freq: Three times a day (TID) | ORAL | Status: DC

## 2015-06-22 NOTE — Progress Notes (Signed)
       Nicole Braun is a 55 y.o. female who presents to [facility] today for rash.  Patient first noted on Tuesday (2 days ago) a scratchy feeling in the Left flank. This was followed by shooting pains from the affected area towards her spine. The next morning she woke up with cluster of pustules erupted on her L superior flank this progressed to another cluster in the LLQ erupting the next day followed by a cluster in the L suprapubic area that erupted today. These clusters became itchy and today are painful. She continues to experience shooting pains along the distribution of the cluster.    Past Medical History  Diagnosis Date  . Allergy     seasonal  . Hypertension    Past Surgical History  Procedure Laterality Date  . Cesarean section  12/87 and 12/89  . Knee surgery  1994    right  . Nose surgery  1994  . Oral sx  1982   Social History  Substance Use Topics  . Smoking status: Never Smoker   . Smokeless tobacco: Not on file  . Alcohol Use: Yes     Comment: 1-3 per week   family history includes Cancer in her sister; Diabetes in her mother; Hyperlipidemia in her mother; Hypertension in her father and mother; Parkinsonism in her father; Stroke in her father.  ROS as above Medications: Current Outpatient Prescriptions  Medication Sig Dispense Refill  . lisinopril-hydrochlorothiazide (PRINZIDE,ZESTORETIC) 10-12.5 MG tablet Take 1 tablet by mouth daily. 30 tablet 5  . loratadine (CLARITIN) 10 MG tablet Take 10 mg by mouth daily.      Marland Kitchen gabapentin (NEURONTIN) 100 MG capsule Take 1 capsule (100 mg total) by mouth 3 (three) times daily. 90 capsule 0  . valACYclovir (VALTREX) 1000 MG tablet Take 1 tablet (1,000 mg total) by mouth 3 (three) times daily. 21 tablet 0   No current facility-administered medications for this visit.   Allergies  Allergen Reactions  . Codeine     Upset stomach  . Lidocaine     Makes feel shakey  . Simvastatin     REACTION: muscle aches.     Exam:  BP 137/85 mmHg  Pulse 79  Temp(Src) 98.2 F (36.8 C)  Wt 209 lb (94.802 kg) Gen: Well appearing woman in NAD HEENT: EOMI,  MMM Lungs: Normal work of breathing. CTABL Heart: RRR no MRG Abd: NABS, Soft. Nondistended, Nontender SKIN: L superior flank 3 separate patches erythema with 5 healing vesicles on top. LLQ: 2 separate patches of erythema with 5 healing vesicles on top L suprapubic area: 3 separate patches of erythema with 3 fluid filled vesicles  The rash does not cross the midline.   Exts: Brisk capillary refill, warm and well perfused.   No results found for this or any previous visit (from the past 24 hour(s)). No results found.   Please see individual assessment and plan sections.

## 2015-06-22 NOTE — Assessment & Plan Note (Signed)
Very likely given history and appearance. Will start Valtrex 1g tid and gabapentin 100mg  tid prn. Follow up prn.

## 2015-06-22 NOTE — Patient Instructions (Signed)
Thank you for coming in today. You were seen for rash. This is very likely to be due to shingles. We will prescribe antiviral medication and medication for pain.  Please tell your son in law to go see his doctor. Follow up with [place] as needed.

## 2015-09-30 ENCOUNTER — Other Ambulatory Visit: Payer: Self-pay | Admitting: Family Medicine

## 2016-01-17 ENCOUNTER — Emergency Department
Admission: EM | Admit: 2016-01-17 | Discharge: 2016-01-17 | Disposition: A | Discharge status: Home / Self Care | Payer: BLUE CROSS/BLUE SHIELD | Source: Home / Self Care | Attending: Family Medicine | Admitting: Family Medicine

## 2016-01-17 ENCOUNTER — Encounter: Payer: Self-pay | Admitting: *Deleted

## 2016-01-17 DIAGNOSIS — R0981 Nasal congestion: Secondary | ICD-10-CM | POA: Diagnosis not present

## 2016-01-17 DIAGNOSIS — H6123 Impacted cerumen, bilateral: Secondary | ICD-10-CM

## 2016-01-17 MED ORDER — PREDNISONE 20 MG PO TABS: ORAL_TABLET | ORAL | 0 refills | Status: DC

## 2016-01-17 MED ORDER — AMOXICILLIN 875 MG PO TABS: 875.0000 mg | ORAL_TABLET | Freq: Two times a day (BID) | ORAL | 0 refills | Status: DC

## 2016-01-17 NOTE — ED Triage Notes (Signed)
Pt c/o nasal congestion and bilateral ear pressure x 3 wks. Denies fever.

## 2016-01-17 NOTE — Discharge Instructions (Addendum)
Take Pseudoephedrine (30mg , one or two every 4 to 6 hours) for sinus congestion.  Get adequate rest.   May use Afrin nasal spray (or generic oxymetazoline) twice daily for about 5 days and then discontinue.  Also recommend using saline nasal spray several times daily and saline nasal irrigation (AYR is a common brand).  Try warm salt water gargles for sore throat.  Stop all antihistamines for now, and other non-prescription cough/cold preparations. Begin Amoxicillin if not improving about one week or if persistent fever develops   Follow-up with family doctor if not improving about10 days.

## 2016-01-17 NOTE — ED Provider Notes (Signed)
[facility]    CSN: [id number] Arrival date & time: 01/17/16  1949  First Provider Contact:  First MD Initiated Contact with Patient 01/17/16 2015        History   Chief Complaint Chief Complaint  Patient presents with  . Nasal Congestion    HPI Nicole Braun is a 55 y.o. female.   Patient complains of 3 week history of persistent sinus congestion and intermittent sore throat.  Her ears have felt clogged.  No cough or other URI symptoms.  She has a history of perennial rhinitis.  Her present symptoms have not responded to Loratidine.  No fevers, chills, and sweats    The history is provided by the patient.    Past Medical History:  Diagnosis Date  . Allergy    seasonal  . Hypertension     Patient Active Problem List   Diagnosis Date Noted  . Shingles 06/22/2015  . Hyperlipidemia 06/01/2009  . HYPERTENSION, BENIGN 04/26/2009    Past Surgical History:  Procedure Laterality Date  . CESAREAN SECTION  12/87 and 12/89  . [place]   right  . NOSE SURGERY  1994  . oral sx  1982    OB History    No data available       Home Medications    Prior to Admission medications   Medication Sig Start Date End Date Taking? Authorizing Provider  amoxicillin (AMOXIL) 875 MG tablet Take 1 tablet (875 mg total) by mouth 2 (two) times daily. (Rx void after 01/25/16) 01/17/16   Kandra Nicolas, MD  gabapentin (NEURONTIN) 100 MG capsule Take 1 capsule (100 mg total) by mouth 3 (three) times daily. 06/22/15   Gregor Hams, MD  lisinopril-hydrochlorothiazide (PRINZIDE,ZESTORETIC) 10-12.5 MG tablet Take 1 tablet by mouth daily. 10/02/15   Hali Marry, MD  loratadine (CLARITIN) 10 MG tablet Take 10 mg by mouth daily.      Historical Provider, MD  predniSONE (DELTASONE) 20 MG tablet Take one tab by mouth twice daily for 5 days, then one daily for 4 days. Take with food. 01/17/16   Kandra Nicolas, MD  valACYclovir (VALTREX) 1000 MG tablet Take 1 tablet  (1,000 mg total) by mouth 3 (three) times daily. 06/22/15   Gregor Hams, MD    Family History Family History  Problem Relation Age of Onset  . Stroke Father   . Parkinsonism Father   . Hypertension Father   . Diabetes Mother   . Hyperlipidemia Mother   . Hypertension Mother   . Cancer Sister     skin    Social History Social History  Substance Use Topics  . Smoking status: Never Smoker  . Smokeless tobacco: Never Used  . Alcohol use Yes     Comment: 1-3 per week     Allergies   Codeine; Lidocaine; and Simvastatin   Review of Systems Review of Systems + sore throat No cough No pleuritic pain No wheezing + nasal congestion + post-nasal drainage + sinus pain/pressure No itchy/red eyes ? earache No hemoptysis No SOB No fever/chills No nausea No vomiting No abdominal pain No diarrhea No urinary symptoms No skin rash No fatigue No myalgias No headache Used OTC meds without relief   Physical Exam Triage Vital Signs ED Triage Vitals  Enc Vitals Group     BP 01/17/16 2013 119/80     Pulse Rate 01/17/16 2013 70     Resp 01/17/16 2013 16  Temp 01/17/16 2013 97.8 F (36.6 C)     Temp Source 01/17/16 2013 Oral     SpO2 01/17/16 2013 98 %     Weight 01/17/16 2014 220 lb (99.8 kg)     Height 01/17/16 2014 5\' 8"  (1.727 m)     Head Circumference --      Peak Flow --      Pain Score 01/17/16 2015 0     Pain Loc --      Pain Edu? --      Excl. in [place]? --    No data found.   Updated Vital Signs BP 119/80 (BP Location: Left Arm)   Pulse 70   Temp 97.8 F (36.6 C) (Oral)   Resp 16   Ht 5\' 8"  (1.727 m)   Wt 220 lb (99.8 kg)   LMP 04/10/2011   SpO2 98%   BMI 33.45 kg/m   Visual Acuity Right Eye Distance:   Left Eye Distance:   Bilateral Distance:    Right Eye Near:   Left Eye Near:    Bilateral Near:     Physical Exam Nursing notes and Vital Signs reviewed. Appearance:  Patient appears stated age, and in no acute distress Eyes:  Pupils  are equal, round, and reactive to light and accomodation.  Extraocular movement is intact.  Conjunctivae are not inflamed  Ears:  Canals occluded with cerumen bilaterally.  Post lavage, canals appear normal.  Tympanic membranes normal.  Nose:  Congested turbinates.  No sinus tenderness.    Pharynx:  Normal Neck:  Supple.  No adenopathy. Lungs:  Clear to auscultation.  Breath sounds are equal.  Moving air well. Heart:  Regular rate and rhythm without murmurs, rubs, or gallops.  Abdomen:  Nontender without masses or hepatosplenomegaly.  Bowel sounds are present.  No CVA or flank tenderness.  Extremities:  No edema.  Skin:  No rash present.    UC Treatments / Results  Labs (all labs ordered are listed, but only abnormal results are displayed) Labs Reviewed - No data to display  EKG  EKG Interpretation None       Radiology No results found.  Procedures Procedures  Bilateral ear lavage by nurse.  Medications Ordered in UC Medications - No data to display   Initial Impression / Assessment and Plan / UC Course  I have reviewed the triage vital signs and the nursing notes.  Pertinent labs & imaging results that were available during my care of the patient were reviewed by me and considered in my medical decision making (see chart for details).  Clinical Course   There is no evidence of bacterial infection today.   Begin prednisone burst/taper. Take Pseudoephedrine (30mg , one or two every 4 to 6 hours) for sinus congestion.  Get adequate rest.   May use Afrin nasal spray (or generic oxymetazoline) twice daily for about 5 days and then discontinue.  Also recommend using saline nasal spray several times daily and saline nasal irrigation (AYR is a common brand).  Try warm salt water gargles for sore throat.  Stop all antihistamines for now, and other non-prescription cough/cold preparations. Begin Amoxicillin if not improving about one week or if persistent fever develops (given Rx  to hold)   Follow-up with family doctor if not improving about10 to 14 days.      Final Clinical Impressions(s) / UC Diagnoses   Final diagnoses:  Nasal sinus congestion  Cerumen impaction, bilateral    New Prescriptions Discharge Medication List  as of 01/17/2016  8:28 PM    START taking these medications   Details  amoxicillin (AMOXIL) 875 MG tablet Take 1 tablet (875 mg total) by mouth 2 (two) times daily. (Rx void after 01/25/16), Starting Wed 01/17/2016, Print    predniSONE (DELTASONE) 20 MG tablet Take one tab by mouth twice daily for 5 days, then one daily for 4 days. Take with food., Print         Kandra Nicolas, MD 01/18/16 [phone]

## 2016-02-19 ENCOUNTER — Ambulatory Visit (INDEPENDENT_AMBULATORY_CARE_PROVIDER_SITE_OTHER): Payer: BLUE CROSS/BLUE SHIELD | Admitting: Family Medicine

## 2016-02-19 ENCOUNTER — Encounter: Payer: Self-pay | Admitting: Family Medicine

## 2016-02-19 VITALS — BP 111/69 | HR 85 | Wt 218.0 lb

## 2016-02-19 DIAGNOSIS — E785 Hyperlipidemia, unspecified: Secondary | ICD-10-CM | POA: Diagnosis not present

## 2016-02-19 DIAGNOSIS — I10 Essential (primary) hypertension: Secondary | ICD-10-CM | POA: Diagnosis not present

## 2016-02-19 DIAGNOSIS — L821 Other seborrheic keratosis: Secondary | ICD-10-CM | POA: Diagnosis not present

## 2016-02-19 NOTE — Progress Notes (Signed)
Subjective:    CC: Skin lesion  HPI: Skin lestion came up 8 wks ago on inside of L leg between calf and ankle.area is raised. She says initially was flesh-colored and now has turned more light brown. No pain or tenderness or trauma. Prior history of skin cancer.  Hypertension- Pt denies chest pain, SOB, dizziness, or heart palpitations.  Taking meds as directed w/o problems.  Denies medication side effects.      Past medical history, Surgical history, Family history not pertinant except as noted below, Social history, Allergies, and medications have been entered into the medical record, reviewed, and corrections made.   Review of Systems: No fevers, chills, night sweats, weight loss, chest pain, or shortness of breath.   Objective:    General: Well Developed, well nourished, and in no acute distress.  Neuro: Alert and oriented x3, extra-ocular muscles intact, sensation grossly intact.  HEENT: Normocephalic, atraumatic  Skin: Warm and dry, no rashes.She has an approximately 3 mm brown raised rough surfaced textured lesion on the right inner leg above the ankle most consistent with a seborrheic keratosis. Cardiac: Regular rate and rhythm, no murmurs rubs or gallops, no lower extremity edema.  Respiratory: Clear to auscultation bilaterally. Not using accessory muscles, speaking in full sentences.   Impression and Recommendations:   Seborrheic keratosis-gave reassurance. Can keep an eye on it. If at some point she would like removal them we can consider cryotherapy or even do a shave biopsy if she is concerned.   HTN - Well controlled. Continue current regimen. Follow up in  6 months.  Due for CMP and lipids.   Hyperlipidemia-due to recheck lipids.

## 2016-02-19 NOTE — Patient Instructions (Signed)
Seborrheic keratosis

## 2016-02-27 ENCOUNTER — Encounter: Payer: Self-pay | Admitting: Family Medicine

## 2016-03-13 DIAGNOSIS — Z1231 Encounter for screening mammogram for malignant neoplasm of breast: Secondary | ICD-10-CM | POA: Diagnosis not present

## 2016-03-13 DIAGNOSIS — Z01419 Encounter for gynecological examination (general) (routine) without abnormal findings: Secondary | ICD-10-CM | POA: Diagnosis not present

## 2016-03-13 DIAGNOSIS — Z6834 Body mass index (BMI) 34.0-34.9, adult: Secondary | ICD-10-CM | POA: Diagnosis not present

## 2016-03-13 LAB — HM MAMMOGRAPHY

## 2016-03-13 LAB — HM PAP SMEAR

## 2016-03-14 ENCOUNTER — Other Ambulatory Visit: Payer: Self-pay | Admitting: Family Medicine

## 2016-03-18 ENCOUNTER — Other Ambulatory Visit: Payer: Self-pay | Admitting: Family Medicine

## 2016-05-13 ENCOUNTER — Other Ambulatory Visit: Payer: Self-pay | Admitting: Family Medicine

## 2016-08-27 DIAGNOSIS — J209 Acute bronchitis, unspecified: Secondary | ICD-10-CM | POA: Diagnosis not present

## 2016-09-12 ENCOUNTER — Other Ambulatory Visit: Payer: Self-pay | Admitting: Family Medicine

## 2016-10-15 ENCOUNTER — Other Ambulatory Visit: Payer: Self-pay | Admitting: Family Medicine

## 2016-11-01 ENCOUNTER — Encounter: Payer: Self-pay | Admitting: Family Medicine

## 2016-11-01 ENCOUNTER — Ambulatory Visit (INDEPENDENT_AMBULATORY_CARE_PROVIDER_SITE_OTHER): Payer: BLUE CROSS/BLUE SHIELD | Admitting: Family Medicine

## 2016-11-01 VITALS — BP 121/62 | HR 66 | Ht 65.75 in | Wt 220.0 lb

## 2016-11-01 DIAGNOSIS — E785 Hyperlipidemia, unspecified: Secondary | ICD-10-CM

## 2016-11-01 DIAGNOSIS — R0602 Shortness of breath: Secondary | ICD-10-CM

## 2016-11-01 DIAGNOSIS — H6122 Impacted cerumen, left ear: Secondary | ICD-10-CM

## 2016-11-01 DIAGNOSIS — L821 Other seborrheic keratosis: Secondary | ICD-10-CM

## 2016-11-01 DIAGNOSIS — I10 Essential (primary) hypertension: Secondary | ICD-10-CM

## 2016-11-01 MED ORDER — LISINOPRIL-HYDROCHLOROTHIAZIDE 10-12.5 MG PO TABS: 1.0000 | ORAL_TABLET | Freq: Every day | ORAL | 1 refills | Status: DC

## 2016-11-01 NOTE — Progress Notes (Signed)
Subjective:    CC: HTN  HPI:  Hypertension- Pt denies chest pain, SOB, dizziness, or heart palpitations.  Taking meds as directed w/o problems.  Denies medication side effects.    Hyperlipidemia-not currently on a statin but due for repeat lipid panel.  She also wanted me to look in her ears. Sometimes she gets a buildup of wax.  She gets her Pap smear mammogram done at [facility] and says she is up-to-date on those. She said she's had her mammogram within the last 6 months.  She reports her colonoscopy was done at [facility] with Dr. Shana Chute, [facility] gastroenterology. Call for report.  Case she'll get the sensation that it's hard to take a deep breath. Just a moment it almost like her breath stops. It literally just lasts a second or 2. At the most it's happened about 3 times within a couple days but typically it's more random and sometimes once a week or less. No recent chest pain or breathing problems.  She also has a lesion on her left abdomen that she would like me to look at today. It's not bothering her. No pain or irritation or itching.  Past medical history, Surgical history, Family history not pertinant except as noted below, Social history, Allergies, and medications have been entered into the medical record, reviewed, and corrections made.   Review of Systems: No fevers, chills, night sweats, weight loss, chest pain, or shortness of breath.   Objective:    General: Well Developed, well nourished, and in no acute distress.  Neuro: Alert and oriented x3, extra-ocular muscles intact, sensation grossly intact.  HEENT: Normocephalic, atraumatic  Skin: Warm and dry, no rashes.She does have a seborrheic keratosis on the left side of her abdomen. Light brown in color. Cardiac: Regular rate and rhythm, no murmurs rubs or gallops, no lower extremity edema.  Respiratory: Clear to auscultation bilaterally. Not using accessory muscles, speaking in full  sentences.   Impression and Recommendations:    HTN - Well controlled. Continue current regimen. Follow up in  6 months.   Hyperlipidemia - due to recheck lipids. She is not fasting but has a hard time getting hears we'll go ahead and order those today.  Left ear cerumen impaction-recommended over-the-counter D proximal drops weekly with home irrigation. If she's not getting results and she can return.  Suspect she is actually experiencing a PVC which is causing her to feel like she is having a hard time with a single breath. It's very transient. Certainly if they persist or become more frequent than on be happy to schedule her for a Holter monitor.  Seborrheic keratosis-gave reassurance. Certainly this can be removed with cryotherapy if she prefers.

## 2016-11-02 LAB — COMPLETE METABOLIC PANEL WITH GFR
ALT: 22 U/L (ref 6–29)
AST: 17 U/L (ref 10–35)
Albumin: 4.2 g/dL (ref 3.6–5.1)
Alkaline Phosphatase: 82 U/L (ref 33–130)
BUN: 15 mg/dL (ref 7–25)
CALCIUM: 9.1 mg/dL (ref 8.6–10.4)
CO2: 25 mmol/L (ref 20–31)
CREATININE: 0.63 mg/dL (ref 0.50–1.05)
Chloride: 106 mmol/L (ref 98–110)
GFR, Est African American: >89 mL/min (ref >=60)
GFR, Est Non African American: >89 mL/min (ref >=60)
GLUCOSE: 117 mg/dL — AB (ref 65–99)
Potassium: 4.1 mmol/L (ref 3.5–5.3)
SODIUM: 140 mmol/L (ref 135–146)
Total Bilirubin: 0.4 mg/dL (ref 0.2–1.2)
Total Protein: 6.3 g/dL (ref 6.1–8.1)

## 2016-11-02 LAB — LIPID PANEL W/REFLEX DIRECT LDL
CHOLESTEROL: 157 mg/dL (ref <200)
HDL: 32 mg/dL — ABNORMAL LOW (ref >50)
LDL-CHOLESTEROL: 99 mg/dL
NON-HDL CHOLESTEROL (CALC): 125 mg/dL (ref <130)
Total CHOL/HDL Ratio: 4.9 Ratio (ref <5.0)
Triglycerides: 160 mg/dL — ABNORMAL HIGH (ref <150)

## 2016-11-15 ENCOUNTER — Encounter: Payer: Self-pay | Admitting: Family Medicine

## 2016-12-03 ENCOUNTER — Ambulatory Visit (INDEPENDENT_AMBULATORY_CARE_PROVIDER_SITE_OTHER): Payer: BLUE CROSS/BLUE SHIELD

## 2016-12-03 ENCOUNTER — Encounter: Payer: Self-pay | Admitting: Family Medicine

## 2016-12-03 ENCOUNTER — Ambulatory Visit (INDEPENDENT_AMBULATORY_CARE_PROVIDER_SITE_OTHER): Payer: BLUE CROSS/BLUE SHIELD | Admitting: Family Medicine

## 2016-12-03 VITALS — BP 128/82 | HR 62 | Ht 65.0 in | Wt 217.0 lb

## 2016-12-03 DIAGNOSIS — M25541 Pain in joints of right hand: Secondary | ICD-10-CM

## 2016-12-03 DIAGNOSIS — M79645 Pain in left finger(s): Secondary | ICD-10-CM | POA: Diagnosis not present

## 2016-12-03 DIAGNOSIS — M25542 Pain in joints of left hand: Secondary | ICD-10-CM | POA: Diagnosis not present

## 2016-12-03 DIAGNOSIS — M79642 Pain in left hand: Secondary | ICD-10-CM | POA: Diagnosis not present

## 2016-12-03 DIAGNOSIS — M2550 Pain in unspecified joint: Secondary | ICD-10-CM

## 2016-12-03 DIAGNOSIS — M79641 Pain in right hand: Secondary | ICD-10-CM | POA: Diagnosis not present

## 2016-12-03 LAB — CBC WITH DIFFERENTIAL/PLATELET
Basophils Absolute: 0 cells/uL (ref 0–200)
Basophils Relative: 0 %
Eosinophils Absolute: 51 cells/uL (ref 15–500)
Eosinophils Relative: 1 %
HEMATOCRIT: 39.3 % (ref 35.0–45.0)
HEMOGLOBIN: 13.3 g/dL (ref 11.7–15.5)
LYMPHS PCT: 33 %
Lymphs Abs: 1683 cells/uL (ref 850–3900)
MCH: 30.6 pg (ref 27.0–33.0)
MCHC: 33.8 g/dL (ref 32.0–36.0)
MCV: 90.6 fL (ref 80.0–100.0)
MONO ABS: 459 {cells}/uL (ref 200–950)
MPV: 9.7 fL (ref 7.5–12.5)
Monocytes Relative: 9 %
NEUTROS PCT: 57 %
Neutro Abs: 2907 cells/uL (ref 1500–7800)
Platelets: 268 10*3/uL (ref 140–400)
RBC: 4.34 MIL/uL (ref 3.80–5.10)
RDW: 12.8 % (ref 11.0–15.0)
WBC: 5.1 10*3/uL (ref 3.8–10.8)

## 2016-12-03 NOTE — Progress Notes (Signed)
Subjective:    Patient ID: Nicole Braun, female    DOB: [birth year], 56 y.o.   MRN: [id number]  HPI 56 year old female comes in today to discuss bilateral hand pain. She's concerned that she may have developed some Oster arthritis in her hands. Her pain is been going on for several months. It's particularly affecting some of her DIPs in a couple of the PIPs. She's not really complaining about the joints over the MCP area. The needle finger on her left hand at the PIP joint is the most bothersome. It swells and is stiff and makes it difficult for her to actually make a fist with that hand. She also has pain in the base of both thumbs at the PIP joints as well. She's been noticing that her grip is been gradually getting a little weaker. She says sometimes the pain is just a dull ache but occasionally it will throb. She says that she is getting some morning stiffness that lasts about 20 minutes in the morning. She denies any fevers chills sweats or abnormal weight loss. No family history of rheumatoid arthritis or autoimmune disorders. No personal history of psoriasis.  She says her right hip is painful as well.  Review of Systems   BP 128/82   Pulse 62   Ht 5\' 5"  (1.651 m)   Wt 217 lb (98.4 kg)   LMP 04/10/2011   BMI 36.11 kg/m     Allergies  Allergen Reactions  . Codeine     Upset stomach  . Lidocaine     Makes feel shakey  . Simvastatin     REACTION: muscle aches.    Past Medical History:  Diagnosis Date  . Allergy    seasonal  . Hypertension     Past Surgical History:  Procedure Laterality Date  . CESAREAN SECTION  12/87 and 12/89  . [place]   right  . NOSE SURGERY  1994  . oral sx  1982    Social History   Social History  . Marital status: Single    Spouse name: N/A  . Number of children: N/A  . Years of education: N/A   Occupational History  . Not on file.   Social History Main Topics  . Smoking status: Never Smoker  . Smokeless tobacco:  Never Used  . Alcohol use Yes     Comment: 1-3 per week  . Drug use: No  . Sexual activity: Not on file   Other Topics Concern  . Not on file   Social History Narrative  . No narrative on file    Family History  Problem Relation Age of Onset  . Stroke Father   . Parkinsonism Father   . Hypertension Father   . Diabetes Mother   . Hyperlipidemia Mother   . Hypertension Mother   . Cancer Sister        skin    Outpatient Encounter Prescriptions as of 12/03/2016  Medication Sig  . Bioflavonoid Products (VITAMIN C) CHEW Chew by mouth.  Marland Kitchen lisinopril-hydrochlorothiazide (PRINZIDE,ZESTORETIC) 10-12.5 MG tablet Take 1 tablet by mouth daily.  Marland Kitchen loratadine (CLARITIN) 10 MG tablet Take 10 mg by mouth daily.     No facility-administered encounter medications on file as of 12/03/2016.           Objective:   Physical Exam  Constitutional: She is oriented to person, place, and time. She appears well-developed and well-nourished.  HENT:  Head: Normocephalic and atraumatic.  Eyes: Conjunctivae  and EOM are normal.  Cardiovascular: Normal rate.   Pulmonary/Chest: Effort normal.  Musculoskeletal:  Does have just a little trace swelling in the DIPs on the first and second fingers on both hands. On the left hand over the PIP again just a little trace swelling. She is unable to fully flex it. Nontender on exam. No bogginess or swelling over the MCPs. Wrists are nontender. Negative squeeze test bilaterally. Normal range of motion and strength of fingers.  Neurological: She is alert and oriented to person, place, and time.  Skin: Skin is dry. No pallor.  Psychiatric: She has a normal mood and affect. Her behavior is normal.  Vitals reviewed.        Assessment & Plan:  Polyarthralgia-suspect Osteoarthritis based on symptoms and exam today. But will do further workup including imaging and labs just for confirmation. Discussed that the main treatment at this point as Tylenol with occasional  use of anti-inflammatories.

## 2016-12-04 LAB — SEDIMENTATION RATE: Sed Rate: 12 mm/hr (ref 0–30)

## 2016-12-04 LAB — RHEUMATOID FACTOR: Rhuematoid fact SerPl-aCnc: <14 IU/mL (ref <14)

## 2016-12-04 LAB — CYCLIC CITRUL PEPTIDE ANTIBODY, IGG: Cyclic Citrullin Peptide Ab: <16 Units

## 2016-12-04 LAB — C-REACTIVE PROTEIN: CRP: 4.1 mg/L (ref <8.0)

## 2017-02-06 ENCOUNTER — Other Ambulatory Visit: Payer: Self-pay | Admitting: Family Medicine

## 2017-07-03 DIAGNOSIS — Z1231 Encounter for screening mammogram for malignant neoplasm of breast: Secondary | ICD-10-CM | POA: Diagnosis not present

## 2017-07-03 DIAGNOSIS — Z01419 Encounter for gynecological examination (general) (routine) without abnormal findings: Secondary | ICD-10-CM | POA: Diagnosis not present

## 2017-07-03 DIAGNOSIS — Z6834 Body mass index (BMI) 34.0-34.9, adult: Secondary | ICD-10-CM | POA: Diagnosis not present

## 2017-08-11 ENCOUNTER — Other Ambulatory Visit: Payer: Self-pay | Admitting: Family Medicine

## 2017-09-12 ENCOUNTER — Other Ambulatory Visit: Payer: Self-pay | Admitting: Family Medicine

## 2017-09-30 ENCOUNTER — Other Ambulatory Visit: Payer: Self-pay | Admitting: Family Medicine

## 2017-10-01 ENCOUNTER — Other Ambulatory Visit: Payer: Self-pay

## 2017-10-01 MED ORDER — LISINOPRIL-HYDROCHLOROTHIAZIDE 10-12.5 MG PO TABS: 1.0000 | ORAL_TABLET | Freq: Every day | ORAL | 0 refills | Status: DC

## 2017-10-01 NOTE — Telephone Encounter (Signed)
Pt scheduled appt to be seen in 2 weeks so calling in 2 week supply of Lisinopril to [facility].   Advised pt that if she does not keep future appt she cannot have any more refills, pt understands.

## 2017-10-15 ENCOUNTER — Encounter: Payer: Self-pay | Admitting: Family Medicine

## 2017-10-15 ENCOUNTER — Ambulatory Visit (INDEPENDENT_AMBULATORY_CARE_PROVIDER_SITE_OTHER): Payer: BLUE CROSS/BLUE SHIELD | Admitting: Family Medicine

## 2017-10-15 VITALS — BP 134/79 | HR 61 | Ht 68.0 in | Wt 227.0 lb

## 2017-10-15 DIAGNOSIS — Z833 Family history of diabetes mellitus: Secondary | ICD-10-CM | POA: Diagnosis not present

## 2017-10-15 DIAGNOSIS — R7309 Other abnormal glucose: Secondary | ICD-10-CM | POA: Diagnosis not present

## 2017-10-15 DIAGNOSIS — Z6837 Body mass index (BMI) 37.0-37.9, adult: Secondary | ICD-10-CM | POA: Diagnosis not present

## 2017-10-15 DIAGNOSIS — I10 Essential (primary) hypertension: Secondary | ICD-10-CM | POA: Diagnosis not present

## 2017-10-15 NOTE — Progress Notes (Signed)
   Subjective:    Patient ID: Nicole Braun, female    DOB: November 13, 1960, 57 y.o.   MRN: [id number]  HPI Hypertension- Pt denies chest pain, SOB, dizziness, or heart palpitations.  Taking meds as directed w/o problems.  Denies medication side effects.    Weight gain - she is up about 10 lbs since last year. Feels like eats well overall. Says she plans on starting to exercise again. Says that makes a big difference in controlling her weight.     Review of Systems     Objective:   Physical Exam  Constitutional: She is oriented to person, place, and time. She appears well-developed and well-nourished.  HENT:  Head: Normocephalic and atraumatic.  Cardiovascular: Normal rate, regular rhythm and normal heart sounds.  Pulmonary/Chest: Effort normal and breath sounds normal.  Neurological: She is alert and oriented to person, place, and time.  Skin: Skin is warm and dry.  Psychiatric: She has a normal mood and affect. Her behavior is normal.        Assessment & Plan:  HTN - Well controlled. Continue current regimen. Follow up in  6 months.    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  She has had a colonoscopy with [facility] so we will fax over form to get that updated in her chart.  She had it done about 4 5 years ago.

## 2017-10-15 NOTE — Patient Instructions (Signed)
DASH Eating Plan DASH stands for "Dietary Approaches to Stop Hypertension." The DASH eating plan is a healthy eating plan that has been shown to reduce high blood pressure (hypertension). It may also reduce your risk for type 2 diabetes, heart disease, and stroke. The DASH eating plan may also help with weight loss. What are tips for following this plan? General guidelines  Avoid eating more than 2,300 mg (milligrams) of salt (sodium) a day. If you have hypertension, you may need to reduce your sodium intake to 1,500 mg a day.  Limit alcohol intake to no more than 1 drink a day for nonpregnant women and 2 drinks a day for men. One drink equals 12 oz of beer, 5 oz of wine, or 1 oz of hard liquor.  Work with your health care provider to maintain a healthy body weight or to lose weight. Ask what an ideal weight is for you.  Get at least 30 minutes of exercise that causes your heart to beat faster (aerobic exercise) most days of the week. Activities may include walking, swimming, or biking.  Work with your health care provider or diet and nutrition specialist (dietitian) to adjust your eating plan to your individual calorie needs. Reading food labels  Check food labels for the amount of sodium per serving. Choose foods with less than 5 percent of the Daily Value of sodium. Generally, foods with less than 300 mg of sodium per serving fit into this eating plan.  To find whole grains, look for the word "whole" as the first word in the ingredient list. Shopping  Buy products labeled as "low-sodium" or "no salt added."  Buy fresh foods. Avoid canned foods and premade or frozen meals. Cooking  Avoid adding salt when cooking. Use salt-free seasonings or herbs instead of table salt or sea salt. Check with your health care provider or pharmacist before using salt substitutes.  Do not fry foods. Cook foods using healthy methods such as baking, boiling, grilling, and broiling instead.  Cook with  heart-healthy oils, such as olive, canola, soybean, or sunflower oil. Meal planning   Eat a balanced diet that includes: ? 5 or more servings of fruits and vegetables each day. At each meal, try to fill half of your plate with fruits and vegetables. ? Up to 6-8 servings of whole grains each day. ? Less than 6 oz of lean meat, poultry, or fish each day. A 3-oz serving of meat is about the same size as a deck of cards. One egg equals 1 oz. ? 2 servings of low-fat dairy each day. ? A serving of nuts, seeds, or beans 5 times each week. ? Heart-healthy fats. Healthy fats called Omega-3 fatty acids are found in foods such as flaxseeds and coldwater fish, like sardines, salmon, and mackerel.  Limit how much you eat of the following: ? Canned or prepackaged foods. ? Food that is high in trans fat, such as fried foods. ? Food that is high in saturated fat, such as fatty meat. ? Sweets, desserts, sugary drinks, and other foods with added sugar. ? Full-fat dairy products.  Do not salt foods before eating.  Try to eat at least 2 vegetarian meals each week.  Eat more home-cooked food and less restaurant, buffet, and fast food.  When eating at a restaurant, ask that your food be prepared with less salt or no salt, if possible. What foods are recommended? The items listed may not be a complete list. Talk with your dietitian about what   dietary choices are best for you. Grains Whole-grain or whole-wheat bread. Whole-grain or whole-wheat pasta. Brown rice. Oatmeal. Quinoa. Bulgur. Whole-grain and low-sodium cereals. Pita bread. Low-fat, low-sodium crackers. Whole-wheat flour tortillas. Vegetables Fresh or frozen vegetables (raw, steamed, roasted, or grilled). Low-sodium or reduced-sodium tomato and vegetable juice. Low-sodium or reduced-sodium tomato sauce and tomato paste. Low-sodium or reduced-sodium canned vegetables. Fruits All fresh, dried, or frozen fruit. Canned fruit in natural juice (without  added sugar). Meat and other protein foods Skinless chicken or turkey. Ground chicken or turkey. Pork with fat trimmed off. Fish and seafood. Egg whites. Dried beans, peas, or lentils. Unsalted nuts, nut butters, and seeds. Unsalted canned beans. Lean cuts of beef with fat trimmed off. Low-sodium, lean deli meat. Dairy Low-fat (1%) or fat-free (skim) milk. Fat-free, low-fat, or reduced-fat cheeses. Nonfat, low-sodium ricotta or cottage cheese. Low-fat or nonfat yogurt. Low-fat, low-sodium cheese. Fats and oils Soft margarine without trans fats. Vegetable oil. Low-fat, reduced-fat, or light mayonnaise and salad dressings (reduced-sodium). Canola, safflower, olive, soybean, and sunflower oils. Avocado. Seasoning and other foods Herbs. Spices. Seasoning mixes without salt. Unsalted popcorn and pretzels. Fat-free sweets. What foods are not recommended? The items listed may not be a complete list. Talk with your dietitian about what dietary choices are best for you. Grains Baked goods made with fat, such as croissants, muffins, or some breads. Dry pasta or rice meal packs. Vegetables Creamed or fried vegetables. Vegetables in a cheese sauce. Regular canned vegetables (not low-sodium or reduced-sodium). Regular canned tomato sauce and paste (not low-sodium or reduced-sodium). Regular tomato and vegetable juice (not low-sodium or reduced-sodium). Pickles. Olives. Fruits Canned fruit in a light or heavy syrup. Fried fruit. Fruit in cream or butter sauce. Meat and other protein foods Fatty cuts of meat. Ribs. Fried meat. Bacon. Sausage. Bologna and other processed lunch meats. Salami. Fatback. Hotdogs. Bratwurst. Salted nuts and seeds. Canned beans with added salt. Canned or smoked fish. Whole eggs or egg yolks. Chicken or turkey with skin. Dairy Whole or 2% milk, cream, and half-and-half. Whole or full-fat cream cheese. Whole-fat or sweetened yogurt. Full-fat cheese. Nondairy creamers. Whipped toppings.  Processed cheese and cheese spreads. Fats and oils Butter. Stick margarine. Lard. Shortening. Ghee. Bacon fat. Tropical oils, such as coconut, palm kernel, or palm oil. Seasoning and other foods Salted popcorn and pretzels. Onion salt, garlic salt, seasoned salt, table salt, and sea salt. Worcestershire sauce. Tartar sauce. Barbecue sauce. Teriyaki sauce. Soy sauce, including reduced-sodium. Steak sauce. Canned and packaged gravies. Fish sauce. Oyster sauce. Cocktail sauce. Horseradish that you find on the shelf. Ketchup. Mustard. Meat flavorings and tenderizers. Bouillon cubes. Hot sauce and Tabasco sauce. Premade or packaged marinades. Premade or packaged taco seasonings. Relishes. Regular salad dressings. Where to find more information:  National Heart, Lung, and Blood Institute: www.nhlbi.nih.gov  American Heart Association: www.heart.org Summary  The DASH eating plan is a healthy eating plan that has been shown to reduce high blood pressure (hypertension). It may also reduce your risk for type 2 diabetes, heart disease, and stroke.  With the DASH eating plan, you should limit salt (sodium) intake to 2,300 mg a day. If you have hypertension, you may need to reduce your sodium intake to 1,500 mg a day.  When on the DASH eating plan, aim to eat more fresh fruits and vegetables, whole grains, lean proteins, low-fat dairy, and heart-healthy fats.  Work with your health care provider or diet and nutrition specialist (dietitian) to adjust your eating plan to your individual   calorie needs. This information is not intended to replace advice given to you by your health care provider. Make sure you discuss any questions you have with your health care provider. Document Released: 05/02/2011 Document Revised: 05/06/2016 Document Reviewed: 05/06/2016 Elsevier Interactive Patient Education  2018 Elsevier Inc.  

## 2017-10-16 ENCOUNTER — Encounter: Payer: Self-pay | Admitting: Family Medicine

## 2017-10-16 DIAGNOSIS — R7301 Impaired fasting glucose: Secondary | ICD-10-CM | POA: Insufficient documentation

## 2017-10-16 LAB — COMPLETE METABOLIC PANEL WITH GFR
AG Ratio: 1.7 (calc) (ref 1.0–2.5)
ALBUMIN MSPROF: 4.5 g/dL (ref 3.6–5.1)
ALKALINE PHOSPHATASE (APISO): 85 U/L (ref 33–130)
ALT: 20 U/L (ref 6–29)
AST: 18 U/L (ref 10–35)
BUN: 20 mg/dL (ref 7–25)
CO2: 30 mmol/L (ref 20–32)
CREATININE: 0.69 mg/dL (ref 0.50–1.05)
Calcium: 9.6 mg/dL (ref 8.6–10.4)
Chloride: 100 mmol/L (ref 98–110)
GFR, Est African American: 114 mL/min/{1.73_m2} (ref >=60)
GFR, Est Non African American: 98 mL/min/{1.73_m2} (ref >=60)
GLUCOSE: 108 mg/dL — AB (ref 65–99)
Globulin: 2.6 g/dL (calc) (ref 1.9–3.7)
Potassium: 3.9 mmol/L (ref 3.5–5.3)
Sodium: 138 mmol/L (ref 135–146)
Total Bilirubin: 0.5 mg/dL (ref 0.2–1.2)
Total Protein: 7.1 g/dL (ref 6.1–8.1)

## 2017-10-16 LAB — LIPID PANEL
CHOL/HDL RATIO: 4.6 (calc) (ref <5.0)
CHOLESTEROL: 223 mg/dL — AB (ref <200)
HDL: 48 mg/dL — AB (ref >50)
LDL Cholesterol (Calc): 144 mg/dL (calc) — ABNORMAL HIGH
Non-HDL Cholesterol (Calc): 175 mg/dL (calc) — ABNORMAL HIGH (ref <130)
TRIGLYCERIDES: 172 mg/dL — AB (ref <150)

## 2017-10-16 LAB — HEMOGLOBIN A1C
HEMOGLOBIN A1C: 6.2 %{Hb} — AB (ref <5.7)
MEAN PLASMA GLUCOSE: 131 (calc)
eAG (mmol/L): 7.3 (calc)

## 2017-10-17 ENCOUNTER — Other Ambulatory Visit: Payer: Self-pay | Admitting: *Deleted

## 2017-10-17 MED ORDER — LISINOPRIL-HYDROCHLOROTHIAZIDE 10-12.5 MG PO TABS: 1.0000 | ORAL_TABLET | Freq: Every day | ORAL | 1 refills | Status: DC

## 2017-12-02 ENCOUNTER — Encounter: Payer: Self-pay | Admitting: Family Medicine

## 2017-12-20 IMAGING — DX DG HAND 2V*L*
2 series · 2 of 2 positions shown · non-contrast
Comparison: None.

CLINICAL DATA: Pt having bilateral knuckle pain and base of the
thumbs for 3 months with no injury,pt unable to remove rings

EXAM:
LEFT HAND - 2 VIEW

[hand pa]
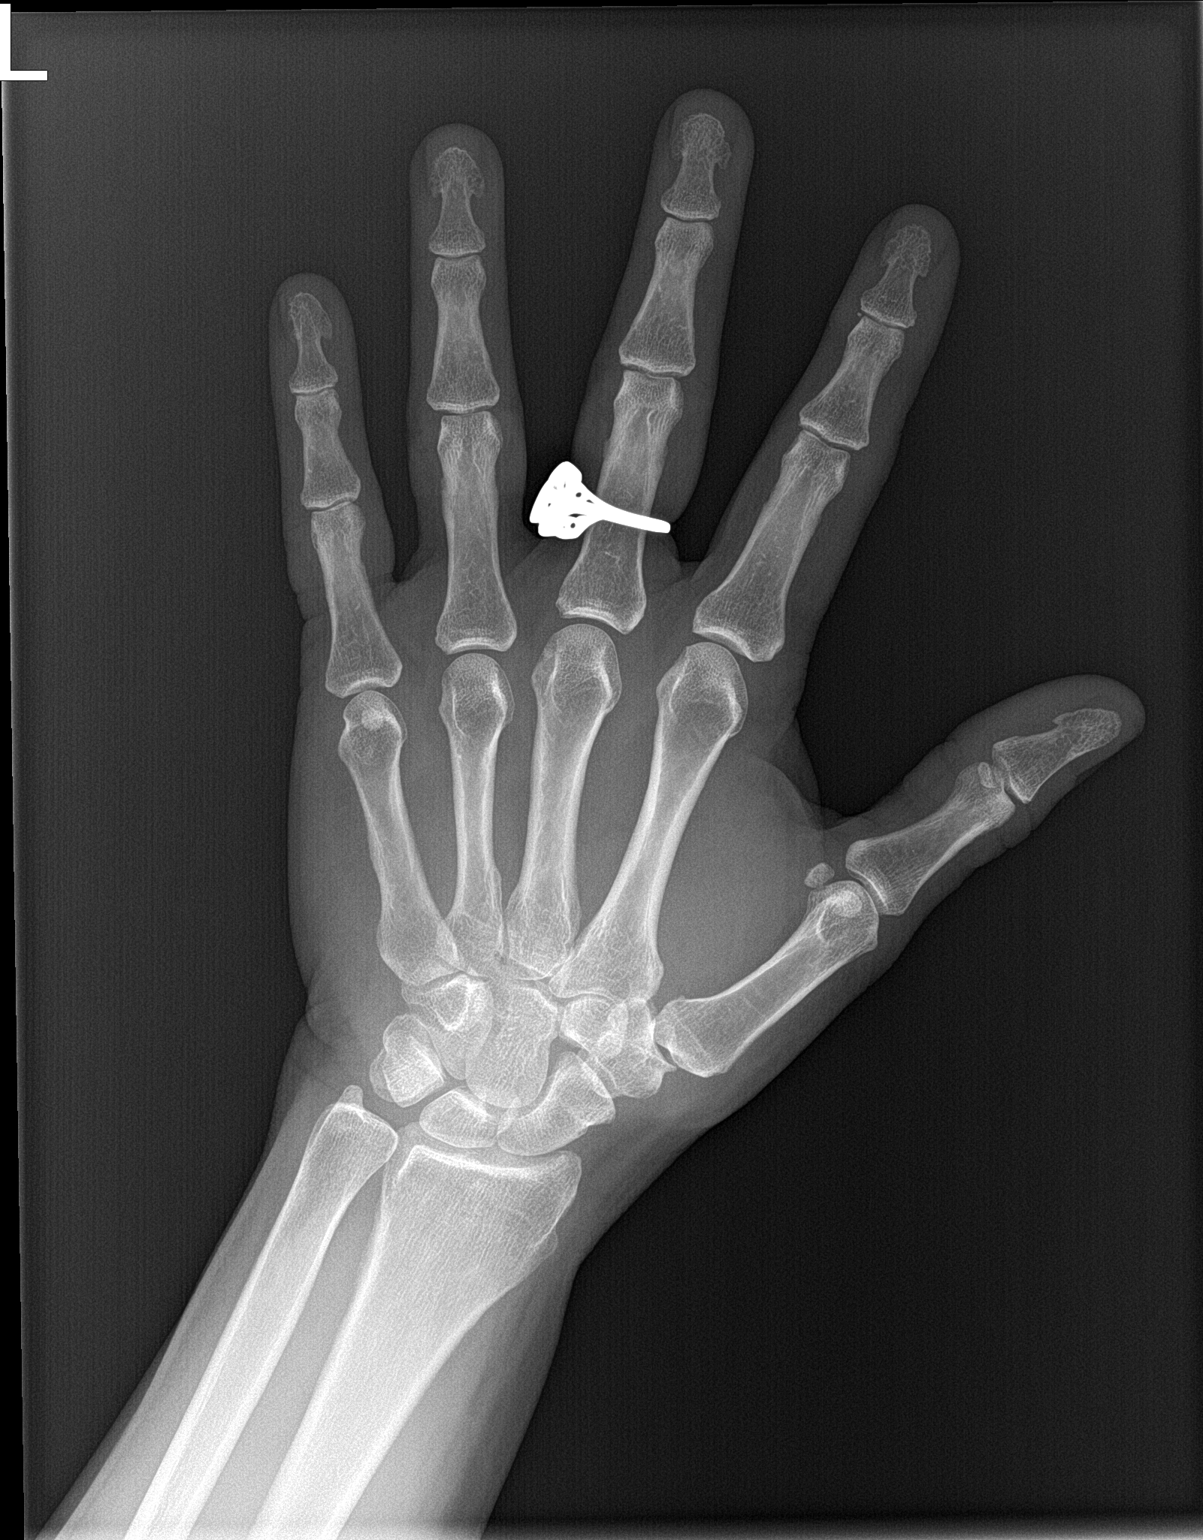

[hand lat]
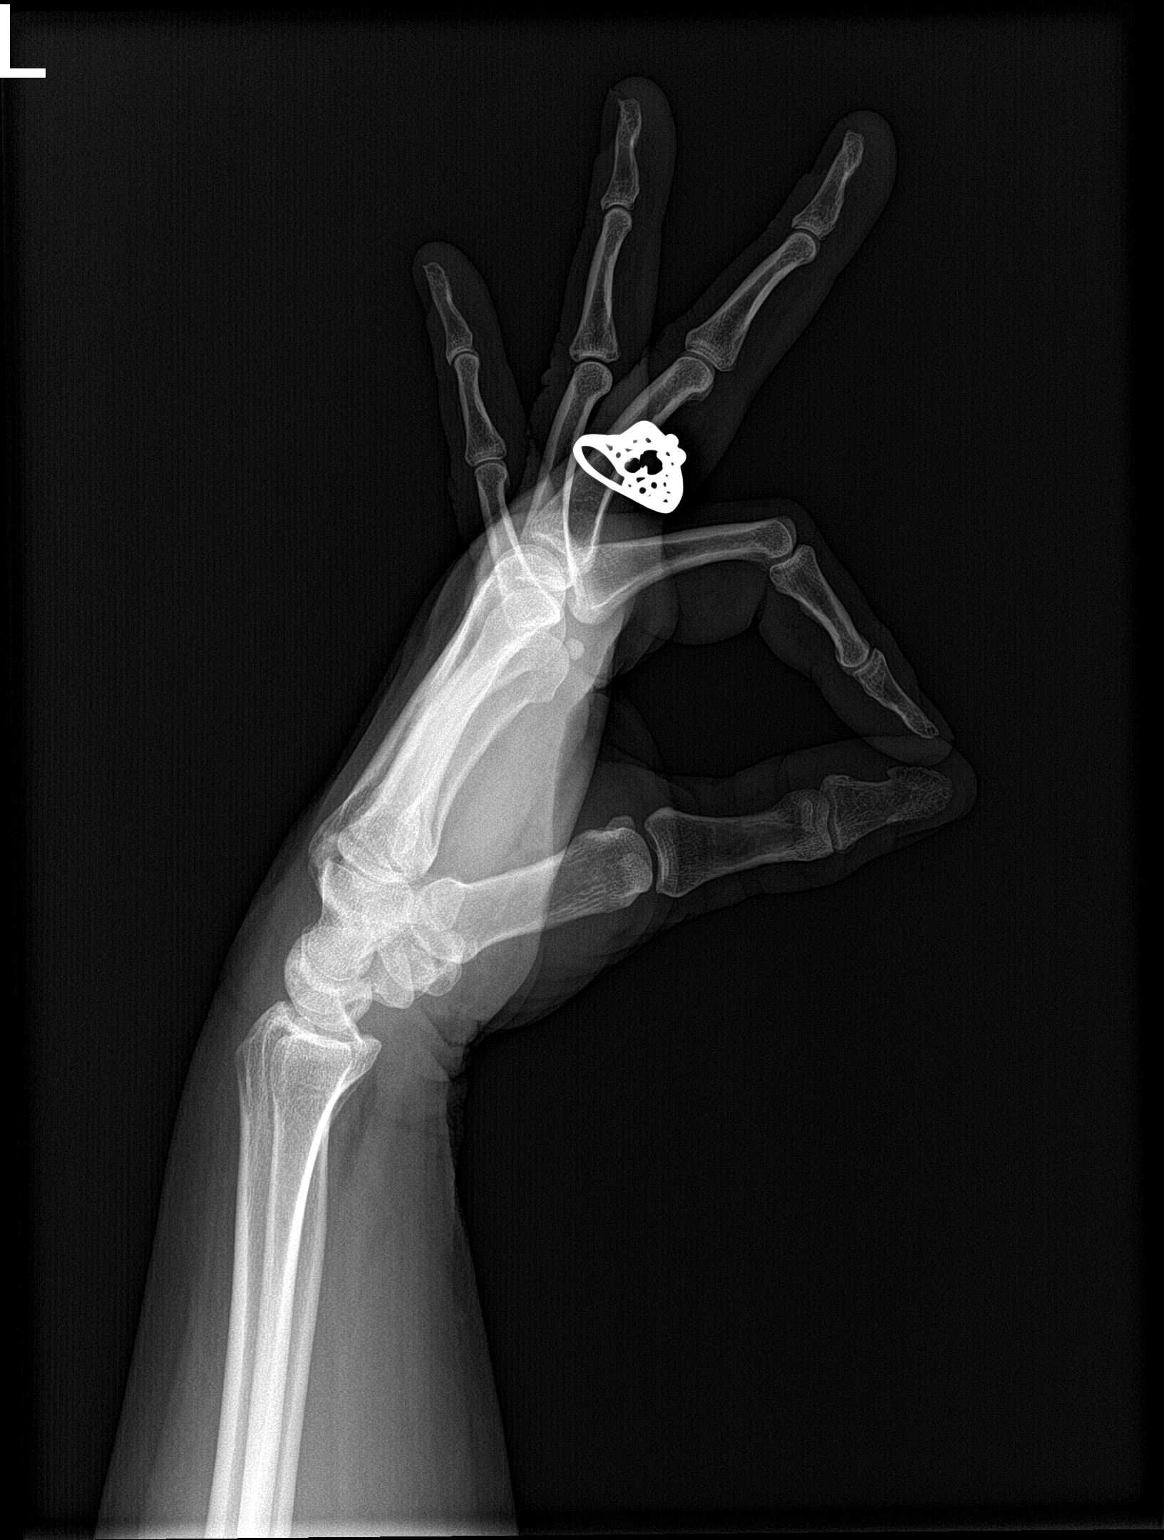

[2 of 2 positions shown; findings below may reference images not displayed]

FINDINGS: There is no evidence of fracture or dislocation. There is no
evidence of arthropathy or other focal bone abnormality. Soft
tissues are unremarkable.
IMPRESSION: Negative.

## 2018-04-17 ENCOUNTER — Other Ambulatory Visit: Payer: Self-pay | Admitting: *Deleted

## 2018-04-17 MED ORDER — LISINOPRIL-HYDROCHLOROTHIAZIDE 10-12.5 MG PO TABS: 1.0000 | ORAL_TABLET | Freq: Every day | ORAL | 1 refills | Status: DC

## 2018-06-09 DIAGNOSIS — N950 Postmenopausal bleeding: Secondary | ICD-10-CM | POA: Diagnosis not present

## 2018-06-16 DIAGNOSIS — R9389 Abnormal findings on diagnostic imaging of other specified body structures: Secondary | ICD-10-CM | POA: Diagnosis not present

## 2018-06-16 DIAGNOSIS — N950 Postmenopausal bleeding: Secondary | ICD-10-CM | POA: Diagnosis not present

## 2018-08-28 DIAGNOSIS — N840 Polyp of corpus uteri: Secondary | ICD-10-CM | POA: Diagnosis not present

## 2018-08-28 DIAGNOSIS — Z3202 Encounter for pregnancy test, result negative: Secondary | ICD-10-CM | POA: Diagnosis not present

## 2018-08-28 DIAGNOSIS — N841 Polyp of cervix uteri: Secondary | ICD-10-CM | POA: Diagnosis not present

## 2018-08-28 DIAGNOSIS — N950 Postmenopausal bleeding: Secondary | ICD-10-CM | POA: Diagnosis not present

## 2018-10-15 ENCOUNTER — Other Ambulatory Visit: Payer: Self-pay | Admitting: Family Medicine

## 2018-10-22 ENCOUNTER — Ambulatory Visit (INDEPENDENT_AMBULATORY_CARE_PROVIDER_SITE_OTHER): Payer: BLUE CROSS/BLUE SHIELD | Admitting: Family Medicine

## 2018-10-22 ENCOUNTER — Encounter: Payer: Self-pay | Admitting: Family Medicine

## 2018-10-22 VITALS — BP 140/87 | HR 77 | Temp 98.4°F | Ht 68.0 in

## 2018-10-22 DIAGNOSIS — I10 Essential (primary) hypertension: Secondary | ICD-10-CM | POA: Diagnosis not present

## 2018-10-22 DIAGNOSIS — E785 Hyperlipidemia, unspecified: Secondary | ICD-10-CM | POA: Diagnosis not present

## 2018-10-22 DIAGNOSIS — R7301 Impaired fasting glucose: Secondary | ICD-10-CM

## 2018-10-22 NOTE — Progress Notes (Signed)
Virtual Visit via Video Note  I connected with Nicole Braun on 10/22/18 at 10:30 AM EDT by a video enabled telemedicine application and verified that I am speaking with the correct person using two identifiers.   I discussed the limitations of evaluation and management by telemedicine and the availability of in person appointments. The patient expressed understanding and agreed to proceed.  Patient was sitting her in her car and I was in my office for this visit.  Subjective:    CC: BP  HPI: Hypertension- Pt denies chest pain, SOB, dizziness, or heart palpitations.  Taking meds as directed w/o problems.  Denies medication side effects.    Impaired fasting glucose-no increased thirst or urination. No symptoms consistent with hypoglycemia.  Has gained about 5- 10 lbs since COVID.  Last A1c.   Lab Results  Component Value Date   CHOL 223 (H) 10/15/2017   HDL 48 (L) 10/15/2017   LDLCALC 144 (H) 10/15/2017   TRIG 172 (H) 10/15/2017   CHOLHDL 4.6 10/15/2017   Hyperlipidemia follow-up-she is not currently on any medications.  But she is due to recheck her labs.  Did want to me let me know that she had a D&C with her OB/GYN at [facility] back in March or April of this year.  She reports that she did have her mammogram done last year but she is due to call back and have that rescheduled for this year.  Past medical history, Surgical history, Family history not pertinant except as noted below, Social history, Allergies, and medications have been entered into the medical record, reviewed, and corrections made.   Review of Systems: No fevers, chills, night sweats, weight loss, chest pain, or shortness of breath.   Objective:    General: Speaking clearly in complete sentences without any shortness of breath.  Alert and oriented x3.  Normal judgment. No apparent acute distress. Well groomed.     Impression and Recommendations:    HTN - BP a little high today.  Feels like it  was because she was running around.  Encouraged her to check it a couple times over the next 2 to 3 weeks just to see if it goes back down.  We discussed that the goal blood pressure should be less than 130 and so if she is noticing higher pressures than that to please call me back and let me know.  Otherwise we will continue with lisinopril HCT.  We do need to get some up-to-date blood work to check renal function and potassium she will try to go in the next few weeks.  IFG - due for repeat A1C .  Her last A1c was very borderline for diabetes so I definitely want a recheck that and make sure that she is on track.  Continue to work on healthy diet and regular exercise.  Hyperlipidemia - due to recheck lipids.      I discussed the assessment and treatment plan with the patient. The patient was provided an opportunity to ask questions and all were answered. The patient agreed with the plan and demonstrated an understanding of the instructions.   The patient was advised to call back or seek an in-person evaluation if the symptoms worsen or if the condition fails to improve as anticipated.   Beatrice Lecher, MD

## 2018-11-25 DIAGNOSIS — D225 Melanocytic nevi of trunk: Secondary | ICD-10-CM | POA: Diagnosis not present

## 2018-11-25 DIAGNOSIS — D2239 Melanocytic nevi of other parts of face: Secondary | ICD-10-CM | POA: Diagnosis not present

## 2018-11-25 DIAGNOSIS — D1801 Hemangioma of skin and subcutaneous tissue: Secondary | ICD-10-CM | POA: Diagnosis not present

## 2018-11-25 DIAGNOSIS — L570 Actinic keratosis: Secondary | ICD-10-CM | POA: Diagnosis not present

## 2018-12-31 DIAGNOSIS — Z01419 Encounter for gynecological examination (general) (routine) without abnormal findings: Secondary | ICD-10-CM | POA: Diagnosis not present

## 2018-12-31 DIAGNOSIS — Z1231 Encounter for screening mammogram for malignant neoplasm of breast: Secondary | ICD-10-CM | POA: Diagnosis not present

## 2018-12-31 DIAGNOSIS — Z1151 Encounter for screening for human papillomavirus (HPV): Secondary | ICD-10-CM | POA: Diagnosis not present

## 2018-12-31 DIAGNOSIS — Z6835 Body mass index (BMI) 35.0-35.9, adult: Secondary | ICD-10-CM | POA: Diagnosis not present

## 2018-12-31 LAB — HM MAMMOGRAPHY

## 2019-01-19 ENCOUNTER — Other Ambulatory Visit: Payer: Self-pay | Admitting: Family Medicine

## 2019-02-20 DIAGNOSIS — Z20828 Contact with and (suspected) exposure to other viral communicable diseases: Secondary | ICD-10-CM | POA: Diagnosis not present

## 2019-02-20 DIAGNOSIS — Z1159 Encounter for screening for other viral diseases: Secondary | ICD-10-CM | POA: Diagnosis not present

## 2019-03-16 ENCOUNTER — Telehealth (INDEPENDENT_AMBULATORY_CARE_PROVIDER_SITE_OTHER): Payer: BC Managed Care – PPO | Admitting: Family Medicine

## 2019-03-16 DIAGNOSIS — J0101 Acute recurrent maxillary sinusitis: Secondary | ICD-10-CM

## 2019-03-16 MED ORDER — AZITHROMYCIN 250 MG PO TABS: 250.0000 mg | ORAL_TABLET | Freq: Every day | ORAL | 0 refills | Status: DC

## 2019-03-16 MED ORDER — AZELASTINE HCL 0.1 % NA SOLN: 2.0000 | Freq: Two times a day (BID) | NASAL | 12 refills | Status: DC

## 2019-03-16 MED ORDER — PREDNISONE 10 MG PO TABS: 30.0000 mg | ORAL_TABLET | Freq: Every day | ORAL | 0 refills | Status: DC

## 2019-03-16 MED ORDER — FLUCONAZOLE 150 MG PO TABS: ORAL_TABLET | ORAL | 1 refills | Status: DC

## 2019-03-16 NOTE — Progress Notes (Signed)
Virtual Visit  via Video Note  I connected with      Nicole Braun by a video enabled telemedicine application and verified that I am speaking with the correct person using two identifiers.   I discussed the limitations of evaluation and management by telemedicine and the availability of in person appointments. The patient expressed understanding and agreed to proceed.  History of Present Illness: Nicole Braun is a 58 y.o. female who would like to discuss sinusitis   Starting about 1 month ago developed sinus pain and pressure. She treated it with OTC medications. Found out she was exposed to COVID-19 and got testing on 9/26 and had a positive test. She had some fatigue and mild fever. She recovered quickly.   However about 3 days ago developed bilateral facial pressure.  She notes she will often have similar symptoms in the fall that often progressed to sinus infection or bronchitis.  She notes sinus pressure and pain and a little bit of nasal discharge but denies wheezing shortness of breath fevers chills nausea vomiting or diarrhea.  Often she has improvement with Z-Pak.  She notes that often if using antibiotic she will have a yeast infection as well and usually would like Diflucan prescribed as well. She is tried over-the-counter cough and cold medications which helps some.   Observations/Objective: LMP 04/10/2011  Wt Readings from Last 5 Encounters:  10/15/17 227 lb (103 kg)  12/03/16 217 lb (98.4 kg)  11/01/16 220 lb (99.8 kg)  02/19/16 218 lb (98.9 kg)  01/17/16 220 lb (99.8 kg)   Exam: Appearance nontoxic appearing Normal Speech.  Tachypnea or wheezing.  Lab and Radiology Results No results found for this or any previous visit (from the past 72 hour(s)). No results found.   Assessment and Plan: 58 y.o. female with sinusitis.  Likely not related to recent COVID-19.  Likely viral at this time.  Plan to continue symptomatic management with over-the-counter  medication as well as Astelin nasal spray.  Backup azithromycin and prednisone prescribed for use if worsening or not improving.  Additionally will use fluconazole if developed a yeast infection if needing to take azithromycin.  Patient knows not to take the azithromycin right away as it likely would not help her current symptoms but would help if she gets worse.  PDMP not reviewed this encounter. No orders of the defined types were placed in this encounter.  No orders of the defined types were placed in this encounter.   Follow Up Instructions:    I discussed the assessment and treatment plan with the patient. The patient was provided an opportunity to ask questions and all were answered. The patient agreed with the plan and demonstrated an understanding of the instructions.   The patient was advised to call back or seek an in-person evaluation if the symptoms worsen or if the condition fails to improve as anticipated.  Time: 15 minutes of intraservice time, with >22 minutes of total time during today's visit.      Historical information moved to improve visibility of documentation.  Past Medical History:  Diagnosis Date  . Allergy    seasonal  . Hypertension    Past Surgical History:  Procedure Laterality Date  . CESAREAN SECTION  12/87 and 12/89  . [place]   right  . NOSE SURGERY  1994  . oral sx  1982   Social History   Tobacco Use  . Smoking status: Never Smoker  . Smokeless  tobacco: Never Used  Substance Use Topics  . Alcohol use: Yes    Comment: 1-3 per week   family history includes Cancer in her sister; Diabetes in her mother; Hyperlipidemia in her mother; Hypertension in her father and mother; Parkinsonism in her father; Stroke in her father.  Medications: Current Outpatient Medications  Medication Sig Dispense Refill  . Bioflavonoid Products (VITAMIN C) CHEW Chew by mouth.    Marland Kitchen lisinopril-hydrochlorothiazide (ZESTORETIC) 10-12.5 MG tablet TAKE  ONE TABLET BY MOUTH DAILY 90 tablet 1  . loratadine (CLARITIN) 10 MG tablet Take 10 mg by mouth daily.       No current facility-administered medications for this visit.    Allergies  Allergen Reactions  . Codeine     Upset stomach  . Lidocaine     Makes feel shakey  . Simvastatin     REACTION: muscle aches.

## 2019-05-20 ENCOUNTER — Other Ambulatory Visit: Payer: Self-pay | Admitting: Family Medicine

## 2019-05-23 ENCOUNTER — Encounter: Payer: Self-pay | Admitting: Family Medicine

## 2019-05-24 ENCOUNTER — Ambulatory Visit (INDEPENDENT_AMBULATORY_CARE_PROVIDER_SITE_OTHER): Payer: BC Managed Care – PPO | Admitting: Family Medicine

## 2019-05-24 ENCOUNTER — Encounter: Payer: Self-pay | Admitting: Family Medicine

## 2019-05-24 ENCOUNTER — Other Ambulatory Visit: Payer: Self-pay

## 2019-05-24 VITALS — BP 131/81 | HR 86 | Ht 68.0 in | Wt 233.0 lb

## 2019-05-24 DIAGNOSIS — R7301 Impaired fasting glucose: Secondary | ICD-10-CM

## 2019-05-24 DIAGNOSIS — R3915 Urgency of urination: Secondary | ICD-10-CM | POA: Diagnosis not present

## 2019-05-24 DIAGNOSIS — N760 Acute vaginitis: Secondary | ICD-10-CM | POA: Diagnosis not present

## 2019-05-24 DIAGNOSIS — E785 Hyperlipidemia, unspecified: Secondary | ICD-10-CM

## 2019-05-24 DIAGNOSIS — R8290 Unspecified abnormal findings in urine: Secondary | ICD-10-CM | POA: Diagnosis not present

## 2019-05-24 DIAGNOSIS — I10 Essential (primary) hypertension: Secondary | ICD-10-CM | POA: Diagnosis not present

## 2019-05-24 LAB — POCT URINALYSIS DIP (CLINITEK)
Bilirubin, UA: NEGATIVE
Blood, UA: NEGATIVE
Glucose, UA: NEGATIVE mg/dL
Ketones, POC UA: NEGATIVE mg/dL
Leukocytes, UA: NEGATIVE
Nitrite, UA: NEGATIVE
POC PROTEIN,UA: NEGATIVE
Spec Grav, UA: 1.025 (ref 1.010–1.025)
Urobilinogen, UA: 0.2 E.U./dL
pH, UA: 5.5 (ref 5.0–8.0)

## 2019-05-24 LAB — POCT GLYCOSYLATED HEMOGLOBIN (HGB A1C): Hemoglobin A1C: 5.8 % — AB (ref 4.0–5.6)

## 2019-05-24 NOTE — Progress Notes (Signed)
Acute Office Visit  Subjective:    Patient ID: Nicole Braun, female    DOB: February 03, 1961, 58 y.o.   MRN: [id number]  Chief Complaint  Patient presents with  . Urinary Tract Infection  . Hypertension    HPI Patient is in today for urinary sxs. +Urgency and cloudy urine. Some back and abdominal pain intermittently for a couple of weeks. Think may have a yeast infection from when she was on abx for sinus infxn in October.   She says she is really not having any itching it is really more just irritation.  She did try an over-the-counter yeast treatment without relief.  She denied is noticing any redness or rash or sores vaginally or perineally.  Hypertension- Pt denies chest pain, SOB, dizziness, or heart palpitations.  Taking meds as directed w/o problems.  Denies medication side effects.    Impaired fasting glucose-no increased thirst or urination. No symptoms consistent with hypoglycemia.   Past Medical History:  Diagnosis Date  . Allergy    seasonal  . Hypertension     Past Surgical History:  Procedure Laterality Date  . CESAREAN SECTION  12/87 and 12/89  . [place]   right  . NOSE SURGERY  1994  . oral sx  1982    Family History  Problem Relation Age of Onset  . Stroke Father   . Parkinsonism Father   . Hypertension Father   . Diabetes Mother   . Hyperlipidemia Mother   . Hypertension Mother   . Cancer Sister        skin    Social History   Socioeconomic History  . Marital status: Married    Spouse name: Not on file  . Number of children: Not on file  . Years of education: Not on file  . Highest education level: Not on file  Occupational History  . Not on file  Tobacco Use  . Smoking status: Never Smoker  . Smokeless tobacco: Never Used  Substance and Sexual Activity  . Alcohol use: Yes    Comment: 1-3 per week  . Drug use: No  . Sexual activity: Not on file  Other Topics Concern  . Not on file  Social History Narrative  . Not on  file   Social Determinants of Health   Financial Resource Strain:   . Difficulty of Paying Living Expenses: Not on file  Food Insecurity:   . Worried About Charity fundraiser in the Last Year: Not on file  . Ran Out of Food in the Last Year: Not on file  Transportation Needs:   . Lack of Transportation (Medical): Not on file  . Lack of Transportation (Non-Medical): Not on file  Physical Activity:   . Days of Exercise per Week: Not on file  . Minutes of Exercise per Session: Not on file  Stress:   . Feeling of Stress : Not on file  Social Connections:   . Frequency of Communication with Friends and Family: Not on file  . Frequency of Social Gatherings with Friends and Family: Not on file  . Attends Religious Services: Not on file  . Active Member of Clubs or Organizations: Not on file  . Attends Archivist Meetings: Not on file  . Marital Status: Not on file  Intimate Partner Violence:   . Fear of Current or Ex-Partner: Not on file  . Emotionally Abused: Not on file  . Physically Abused: Not on file  . Sexually  Abused: Not on file    Outpatient Medications Prior to Visit  Medication Sig Dispense Refill  . azelastine (ASTELIN) 0.1 % nasal spray Place 2 sprays into both nostrils 2 (two) times daily. Use in each nostril as directed 30 mL 12  . Bioflavonoid Products (VITAMIN C) CHEW Chew by mouth.    Marland Kitchen lisinopril-hydrochlorothiazide (ZESTORETIC) 10-12.5 MG tablet TAKE ONE TABLET BY MOUTH DAILY 90 tablet 1  . loratadine (CLARITIN) 10 MG tablet Take 10 mg by mouth daily.      Marland Kitchen azithromycin (ZITHROMAX) 250 MG tablet Take 1 tablet (250 mg total) by mouth daily. Take first 2 tablets together, then 1 every day until finished. 6 tablet 0  . fluconazole (DIFLUCAN) 150 MG tablet Take 1 pill po for yeast infection. Repeat in 3 days if symptoms still present. 2 tablet 1  . predniSONE (DELTASONE) 10 MG tablet Take 3 tablets (30 mg total) by mouth daily with breakfast. 15 tablet 0    No facility-administered medications prior to visit.    Allergies  Allergen Reactions  . Codeine     Upset stomach  . Lidocaine     Makes feel shakey  . Simvastatin     REACTION: muscle aches.    Review of Systems     Objective:    Physical Exam Constitutional:      Appearance: She is well-developed.  HENT:     Head: Normocephalic and atraumatic.  Cardiovascular:     Rate and Rhythm: Normal rate and regular rhythm.     Heart sounds: Normal heart sounds.  Pulmonary:     Effort: Pulmonary effort is normal.     Breath sounds: Normal breath sounds.  Abdominal:     General: Abdomen is flat. Bowel sounds are normal.     Palpations: Abdomen is soft.     Comments: No CVA tenderness  Skin:    General: Skin is warm and dry.  Neurological:     Mental Status: She is alert and oriented to person, place, and time.  Psychiatric:        Behavior: Behavior normal.     BP 131/81   Pulse 86   Ht 5\' 8"  (1.727 m)   Wt 233 lb (105.7 kg)   LMP 04/10/2011   SpO2 97%   BMI 35.43 kg/m  Wt Readings from Last 3 Encounters:  05/24/19 233 lb (105.7 kg)  10/15/17 227 lb (103 kg)  12/03/16 217 lb (98.4 kg)    Health Maintenance Due  Topic Date Due  . HIV Screening  06/14/1977  . TETANUS/TDAP  05/29/2008  . MAMMOGRAM  03/13/2018    There are no preventive care reminders to display for this patient.   Lab Results  Component Value Date   TSH 2.036 12/17/2013   Lab Results  Component Value Date   WBC 5.1 12/03/2016   HGB 13.3 12/03/2016   HCT 39.3 12/03/2016   MCV 90.6 12/03/2016   PLT 268 12/03/2016   Lab Results  Component Value Date   NA 138 10/15/2017   K 3.9 10/15/2017   CO2 30 10/15/2017   GLUCOSE 108 (H) 10/15/2017   BUN 20 10/15/2017   CREATININE 0.69 10/15/2017   BILITOT 0.5 10/15/2017   ALKPHOS 82 11/01/2016   AST 18 10/15/2017   ALT 20 10/15/2017   PROT 7.1 10/15/2017   ALBUMIN 4.2 11/01/2016   CALCIUM 9.6 10/15/2017   Lab Results  Component  Value Date   CHOL 223 (H) 10/15/2017   Lab  Results  Component Value Date   HDL 48 (L) 10/15/2017   Lab Results  Component Value Date   LDLCALC 144 (H) 10/15/2017   Lab Results  Component Value Date   TRIG 172 (H) 10/15/2017   Lab Results  Component Value Date   CHOLHDL 4.6 10/15/2017   Lab Results  Component Value Date   HGBA1C 5.8 (A) 05/24/2019       Assessment & Plan:   Problem List Items Addressed This Visit      Cardiovascular and Mediastinum   HYPERTENSION, BENIGN - Primary    Well controlled. Continue current regimen. Follow up in  6 months.        Relevant Orders   COMPLETE METABOLIC PANEL WITH GFR   Lipid panel     Endocrine   IFG (impaired fasting glucose)    A1C is great!! A1C is down to 5.8 today. F/U in 6 months.        Relevant Orders   COMPLETE METABOLIC PANEL WITH GFR   Lipid panel   POCT HgB A1C (Completed)     Other   Hyperlipidemia    Due to recheck your lipoids       Other Visit Diagnoses    Cloudy urine       Relevant Orders   POCT URINALYSIS DIP (CLINITEK) (Completed)   Acute vaginitis       Relevant Orders   WET PREP FOR TRICH, YEAST, CLUE   Urinary urgency       Relevant Orders   Urine Culture     Acute vaginitis-we will send wet prep and call with results once available.  Also discussed that it could be vaginal atrophy causing irritation as well as she is postmenopausal now.  If the culture and the wet prep are both negative then consider a feminine moisturizer such as Replens and trying that for a few weeks to see if it helps.  Urinary frequency/cloudy urine-dipstick urinalysis was negative.  But will send for culture for confirmation.   No orders of the defined types were placed in this encounter.    Beatrice Lecher, MD

## 2019-05-24 NOTE — Assessment & Plan Note (Signed)
Due to recheck your lipoids

## 2019-05-24 NOTE — Assessment & Plan Note (Signed)
Well controlled. Continue current regimen. Follow up in  6 months.  

## 2019-05-24 NOTE — Assessment & Plan Note (Signed)
A1C is great!! A1C is down to 5.8 today. F/U in 6 months.

## 2019-05-25 LAB — WET PREP FOR TRICH, YEAST, CLUE
MICRO NUMBER:: 1232727
Specimen Quality: ADEQUATE

## 2019-05-26 LAB — URINE CULTURE
MICRO NUMBER:: 1233307
Result:: NO GROWTH
SPECIMEN QUALITY:: ADEQUATE

## 2019-06-04 ENCOUNTER — Encounter: Payer: Self-pay | Admitting: Family Medicine

## 2019-08-05 ENCOUNTER — Other Ambulatory Visit: Payer: Self-pay | Admitting: Family Medicine

## 2019-11-08 ENCOUNTER — Other Ambulatory Visit: Payer: Self-pay | Admitting: Family Medicine

## 2019-12-29 ENCOUNTER — Other Ambulatory Visit: Payer: Self-pay

## 2019-12-29 ENCOUNTER — Ambulatory Visit (INDEPENDENT_AMBULATORY_CARE_PROVIDER_SITE_OTHER): Payer: 59 | Admitting: Family Medicine

## 2019-12-29 ENCOUNTER — Encounter: Payer: Self-pay | Admitting: Family Medicine

## 2019-12-29 VITALS — BP 143/87 | HR 73 | Temp 97.5°F | Ht 68.11 in | Wt 232.6 lb

## 2019-12-29 DIAGNOSIS — M2550 Pain in unspecified joint: Secondary | ICD-10-CM

## 2019-12-29 DIAGNOSIS — Z20822 Contact with and (suspected) exposure to covid-19: Secondary | ICD-10-CM | POA: Diagnosis not present

## 2019-12-29 DIAGNOSIS — E785 Hyperlipidemia, unspecified: Secondary | ICD-10-CM

## 2019-12-29 DIAGNOSIS — I10 Essential (primary) hypertension: Secondary | ICD-10-CM | POA: Diagnosis not present

## 2019-12-29 MED ORDER — LISINOPRIL-HYDROCHLOROTHIAZIDE 10-12.5 MG PO TABS: 2.0000 | ORAL_TABLET | Freq: Every day | ORAL | 1 refills | Status: DC

## 2019-12-29 MED ORDER — PREDNISONE 10 MG (21) PO TBPK
ORAL_TABLET | ORAL | 0 refills | Status: DC
Start: 2019-12-29 — End: 2020-04-11

## 2019-12-29 NOTE — Progress Notes (Signed)
Nicole Braun - 59 y.o. female MRN [id number]  Date of birth: 1960/10/27  Subjective Chief Complaint  Patient presents with  . Hypertension    HPI Nicole Braun is a 59 y.o. female with history of HTN and HLD here today for follow up.  She needs updated labs and meds refilled.  She would also like to discuss COVID antibody testing.    -HTN:  Current treatment with lisinopril/hctz 10/12.5mg .  Has had elevated BP readings at home as well as some occasional headaches.  She has not made any recent changes to diet or activity level.  She denies chest pain, shortness of breath, palpitations, vision changes.   -Joint Pain:  Had episode a few months ago where she had swollen and very painful R knee.  No preceding injury.  Seen by ortho and had MRI of the knee which was negative.  She did receive steroid injection which resolved symptoms within 48 hours.  She reports continued joint pain in bilateral elbows and wrists.  Also has had some recent pain radiation from neck down R arm with numbness in fingers.  This is improving.    -Antibody testing:  She believes she had COVID previously and would like to have antibody testing.   ROS:  A comprehensive ROS was completed and negative except as noted per HPI  Allergies  Allergen Reactions  . Codeine     Upset stomach  . Lidocaine     Makes feel shakey  . Simvastatin     REACTION: muscle aches.    Past Medical History:  Diagnosis Date  . Allergy    seasonal  . Hypertension     Past Surgical History:  Procedure Laterality Date  . CESAREAN SECTION  12/87 and 12/89  . [place]   right  . NOSE SURGERY  1994  . oral sx  1982    Social History   Socioeconomic History  . Marital status: Married    Spouse name: Not on file  . Number of children: Not on file  . Years of education: Not on file  . Highest education level: Not on file  Occupational History  . Not on file  Tobacco Use  . Smoking status: Never Smoker   . Smokeless tobacco: Never Used  Substance and Sexual Activity  . Alcohol use: Yes    Comment: 1-3 per week  . Drug use: No  . Sexual activity: Not on file  Other Topics Concern  . Not on file  Social History Narrative  . Not on file   Social Determinants of Health   Financial Resource Strain:   . Difficulty of Paying Living Expenses:   Food Insecurity:   . Worried About Charity fundraiser in the Last Year:   . Arboriculturist in the Last Year:   Transportation Needs:   . Film/video editor (Medical):   Marland Kitchen Lack of Transportation (Non-Medical):   Physical Activity:   . Days of Exercise per Week:   . Minutes of Exercise per Session:   Stress:   . Feeling of Stress :   Social Connections:   . Frequency of Communication with Friends and Family:   . Frequency of Social Gatherings with Friends and Family:   . Attends Religious Services:   . Active Member of Clubs or Organizations:   . Attends Archivist Meetings:   Marland Kitchen Marital Status:     Family History  Problem Relation Age of Onset  .  Stroke Father   . Parkinsonism Father   . Hypertension Father   . Diabetes Mother   . Hyperlipidemia Mother   . Hypertension Mother   . Cancer Sister        skin    Health Maintenance  Topic Date Due  . COVID-19 Vaccine (1) Never done  . HIV Screening  Never done  . TETANUS/TDAP  05/29/2008  . INFLUENZA VACCINE  12/26/2019  . MAMMOGRAM  12/30/2020  . PAP SMEAR-Modifier  03/13/2021  . COLONOSCOPY  05/13/2023  . Hepatitis C Screening  Completed     ----------------------------------------------------------------------------------------------------------------------------------------------------------------------------------------------------------------- Physical Exam BP (!) 143/87 (BP Location: Left Arm, Patient Position: Sitting, Cuff Size: Normal)   Pulse 73   Temp (!) 97.5 F (36.4 C) (Temporal)   Ht 5' 8.11" (1.73 m)   Wt 232 lb 9.6 oz (105.5 kg)   LMP  04/10/2011   SpO2 98%   BMI 35.25 kg/m   Physical Exam HENT:     Head: Normocephalic and atraumatic.     Right Ear: Tympanic membrane normal.     Left Ear: Tympanic membrane normal.  Eyes:     General: No scleral icterus. Cardiovascular:     Rate and Rhythm: Normal rate and regular rhythm.  Pulmonary:     Effort: Pulmonary effort is normal.     Breath sounds: Normal breath sounds.  Musculoskeletal:     Cervical back: Neck supple.  Neurological:     General: No focal deficit present.  Psychiatric:        Mood and Affect: Mood normal.     ------------------------------------------------------------------------------------------------------------------------------------------------------------------------------------------------------------------- Assessment and Plan  HYPERTENSION, BENIGN Blood pressure is not at goal at for age and co-morbidities.  I recommend that she increase lisinopril/hctz to 2 tabs daily.  In addition they were instructed to follow a low sodium diet with regular exercise to help to maintain adequate control of blood pressure.    Hyperlipidemia Update lipid panel.   Arthralgia Her previous episode of knee pain is suspicious for gout.  Having pain in elbows and wrists as well.  Will check uric acid levels.  Can consider additional rheumatological work up if this persists.    Pain in neck seems radicular and appears to be improving on its own.   Rx for prednisone sent in to start if this worsens again.   Exposure to COVID-19 virus I explained to her that antibody testing only tells if she had exposure or prior COVID infection.  It does not mean that she has immunity and is not a substitute for vaccination.  She wishes to proceed with this test.     Meds ordered this encounter  Medications  . lisinopril-hydrochlorothiazide (ZESTORETIC) 10-12.5 MG tablet    Sig: Take 2 tablets by mouth daily. [place].APPOINTMENT AND LABS REQUIRED FOR  REFILLS    Dispense:  180 tablet    Refill:  1  . predniSONE (STERAPRED UNI-PAK 21 TAB) 10 MG (21) TBPK tablet    Sig: Taper as directed on packaging.    Dispense:  21 tablet    Refill:  0    Return in about 3 months (around 03/30/2020) for HTN f/u with PCP.    This visit occurred during the SARS-CoV-2 public health emergency.  Safety protocols were in place, including screening questions prior to the visit, additional usage of staff PPE, and extensive cleaning of exam room while observing appropriate contact time as indicated for disinfecting solutions.

## 2019-12-29 NOTE — Assessment & Plan Note (Signed)
Update lipid panel.  

## 2019-12-29 NOTE — Assessment & Plan Note (Addendum)
I explained to her that antibody testing only tells if she had exposure or prior COVID infection.  It does not mean that she has immunity and is not a substitute for vaccination.  She wishes to proceed with this test.

## 2019-12-29 NOTE — Patient Instructions (Addendum)
Increase lisinopril/hctz to 2 tabs daily.  Continue to monitor BP at home.  Have labs completed.  IF pain returns try adding prednisone.   F/u with Dr. Madilyn Fireman in 3 months for BP follow up.

## 2019-12-29 NOTE — Assessment & Plan Note (Signed)
Her previous episode of knee pain is suspicious for gout.  Having pain in elbows and wrists as well.  Will check uric acid levels.  Can consider additional rheumatological work up if this persists.    Pain in neck seems radicular and appears to be improving on its own.   Rx for prednisone sent in to start if this worsens again.

## 2019-12-29 NOTE — Assessment & Plan Note (Signed)
Blood pressure is not at goal at for age and co-morbidities.  I recommend that she increase lisinopril/hctz to 2 tabs daily.  In addition they were instructed to follow a low sodium diet with regular exercise to help to maintain adequate control of blood pressure.

## 2020-01-01 LAB — SARS-COV-2 SEMI-QUANTITATIVE TOTAL ANTIBODY, SPIKE: SARS COV2 AB, Total Spike Semi QN: 783.7 U/mL — ABNORMAL HIGH (ref <0.8)

## 2020-01-01 LAB — COMPLETE METABOLIC PANEL WITH GFR
AG Ratio: 1.6 (calc) (ref 1.0–2.5)
ALT: 24 U/L (ref 6–29)
AST: 16 U/L (ref 10–35)
Albumin: 4.7 g/dL (ref 3.6–5.1)
Alkaline phosphatase (APISO): 82 U/L (ref 37–153)
BUN: 17 mg/dL (ref 7–25)
CO2: 32 mmol/L (ref 20–32)
Calcium: 10.2 mg/dL (ref 8.6–10.4)
Chloride: 100 mmol/L (ref 98–110)
Creat: 0.69 mg/dL (ref 0.50–1.05)
GFR, Est African American: 112 mL/min/{1.73_m2} (ref >=60)
GFR, Est Non African American: 97 mL/min/{1.73_m2} (ref >=60)
Globulin: 2.9 g/dL (calc) (ref 1.9–3.7)
Glucose, Bld: 83 mg/dL (ref 65–139)
Potassium: 4.4 mmol/L (ref 3.5–5.3)
Sodium: 138 mmol/L (ref 135–146)
Total Bilirubin: 0.6 mg/dL (ref 0.2–1.2)
Total Protein: 7.6 g/dL (ref 6.1–8.1)

## 2020-01-01 LAB — CBC
HCT: 40.3 % (ref 35.0–45.0)
Hemoglobin: 13.6 g/dL (ref 11.7–15.5)
MCH: 30.7 pg (ref 27.0–33.0)
MCHC: 33.7 g/dL (ref 32.0–36.0)
MCV: 91 fL (ref 80.0–100.0)
MPV: 10.3 fL (ref 7.5–12.5)
Platelets: 244 10*3/uL (ref 140–400)
RBC: 4.43 10*6/uL (ref 3.80–5.10)
RDW: 11.7 % (ref 11.0–15.0)
WBC: 5.2 10*3/uL (ref 3.8–10.8)

## 2020-01-01 LAB — LIPID PANEL
Cholesterol: 253 mg/dL — ABNORMAL HIGH (ref <200)
HDL: 49 mg/dL — ABNORMAL LOW (ref >=50)
LDL Cholesterol (Calc): 160 mg/dL (calc) — ABNORMAL HIGH
Non-HDL Cholesterol (Calc): 204 mg/dL (calc) — ABNORMAL HIGH (ref <130)
Total CHOL/HDL Ratio: 5.2 (calc) — ABNORMAL HIGH (ref <5.0)
Triglycerides: 269 mg/dL — ABNORMAL HIGH (ref <150)

## 2020-01-01 LAB — URIC ACID: Uric Acid, Serum: 6.6 mg/dL (ref 2.5–7.0)

## 2020-03-09 LAB — HM MAMMOGRAPHY

## 2020-03-13 LAB — HM PAP SMEAR: HM Pap smear: NEGATIVE

## 2020-03-30 ENCOUNTER — Ambulatory Visit: Payer: 59 | Admitting: Family Medicine

## 2020-04-11 ENCOUNTER — Ambulatory Visit (INDEPENDENT_AMBULATORY_CARE_PROVIDER_SITE_OTHER): Payer: 59 | Admitting: Family Medicine

## 2020-04-11 ENCOUNTER — Encounter: Payer: Self-pay | Admitting: Family Medicine

## 2020-04-11 ENCOUNTER — Ambulatory Visit (INDEPENDENT_AMBULATORY_CARE_PROVIDER_SITE_OTHER): Payer: 59

## 2020-04-11 ENCOUNTER — Other Ambulatory Visit: Payer: Self-pay

## 2020-04-11 VITALS — BP 121/67 | HR 85 | Ht 68.0 in | Wt 236.0 lb

## 2020-04-11 DIAGNOSIS — M79674 Pain in right toe(s): Secondary | ICD-10-CM

## 2020-04-11 DIAGNOSIS — M79671 Pain in right foot: Secondary | ICD-10-CM

## 2020-04-11 NOTE — Progress Notes (Signed)
Acute Office Visit  Subjective:    Patient ID: Nicole Braun, female    DOB: 05/05/1961, 59 y.o.   MRN: [id number]  Chief Complaint  Patient presents with  . Foot Pain    HPI Patient is in today for pain in her right foot x 2 weeks but worse the last couple of week. Pain is mostly between the third and fourth distal metatarsal heads and radiates into the fourth toe.  She describes it as a burning, stinging and throbbing type sensation. Worse at the end of the day.  Feels like a string is wrapped around it.  Happens more when wears closed toe shoe.  She denies any known injury or trauma.  She does not wear high heels.  Past Medical History:  Diagnosis Date  . Allergy    seasonal  . Hypertension     Past Surgical History:  Procedure Laterality Date  . CESAREAN SECTION  12/87 and 12/89  . [place]   right  . NOSE SURGERY  1994  . oral sx  1982    Family History  Problem Relation Age of Onset  . Stroke Father   . Parkinsonism Father   . Hypertension Father   . Diabetes Mother   . Hyperlipidemia Mother   . Hypertension Mother   . Cancer Sister        skin    Social History   Socioeconomic History  . Marital status: Married    Spouse name: Not on file  . Number of children: Not on file  . Years of education: Not on file  . Highest education level: Not on file  Occupational History  . Not on file  Tobacco Use  . Smoking status: Never Smoker  . Smokeless tobacco: Never Used  Substance and Sexual Activity  . Alcohol use: Yes    Comment: 1-3 per week  . Drug use: No  . Sexual activity: Not on file  Other Topics Concern  . Not on file  Social History Narrative  . Not on file   Social Determinants of Health   Financial Resource Strain:   . Difficulty of Paying Living Expenses: Not on file  Food Insecurity:   . Worried About Charity fundraiser in the Last Year: Not on file  . Ran Out of Food in the Last Year: Not on file  Transportation  Needs:   . Lack of Transportation (Medical): Not on file  . Lack of Transportation (Non-Medical): Not on file  Physical Activity:   . Days of Exercise per Week: Not on file  . Minutes of Exercise per Session: Not on file  Stress:   . Feeling of Stress : Not on file  Social Connections:   . Frequency of Communication with Friends and Family: Not on file  . Frequency of Social Gatherings with Friends and Family: Not on file  . Attends Religious Services: Not on file  . Active Member of Clubs or Organizations: Not on file  . Attends Archivist Meetings: Not on file  . Marital Status: Not on file  Intimate Partner Violence:   . Fear of Current or Ex-Partner: Not on file  . Emotionally Abused: Not on file  . Physically Abused: Not on file  . Sexually Abused: Not on file    Outpatient Medications Prior to Visit  Medication Sig Dispense Refill  . acetaminophen (TYLENOL) 650 MG CR tablet Take 650 mg by mouth as needed for pain.    Marland Kitchen  Ascorbic Acid (VITAMIN C) 100 MG tablet Take 500 mg by mouth daily.    Marland Kitchen lisinopril-hydrochlorothiazide (ZESTORETIC) 10-12.5 MG tablet Take 2 tablets by mouth daily. [facility].APPOINTMENT AND LABS REQUIRED FOR REFILLS 180 tablet 1  . loratadine (CLARITIN) 10 MG tablet Take 10 mg by mouth daily.      Marland Kitchen VITAMIN D PO Take by mouth.    Marland Kitchen azelastine (ASTELIN) 0.1 % nasal spray Place 2 sprays into both nostrils 2 (two) times daily. Use in each nostril as directed (Patient not taking: Reported on 12/29/2019) 30 mL 12  . Bioflavonoid Products (VITAMIN C) CHEW Chew by mouth.    . predniSONE (STERAPRED UNI-PAK 21 TAB) 10 MG (21) TBPK tablet Taper as directed on packaging. 21 tablet 0   No facility-administered medications prior to visit.    Allergies  Allergen Reactions  . Codeine     Upset stomach  . Lidocaine     Makes feel shakey  . Simvastatin     REACTION: muscle aches.    Review of Systems     Objective:    Physical Exam Vitals  reviewed.  Constitutional:      Appearance: She is well-developed.  HENT:     Head: Normocephalic and atraumatic.  Eyes:     Conjunctiva/sclera: Conjunctivae normal.  Cardiovascular:     Rate and Rhythm: Normal rate.  Pulmonary:     Effort: Pulmonary effort is normal.  Musculoskeletal:     Comments: Right foot with no abnormalities or swelling.  Normal flexion-extension.  Her third toe does drift slightly medially underneath her second toe.  Nontender on exam.  Did Breo the 34th metatarsal heads together and she did have discomfort in about 2 to 3 minutes later started developing pain.  Skin:    General: Skin is dry.     Coloration: Skin is not pale.  Neurological:     Mental Status: She is alert and oriented to person, place, and time.  Psychiatric:        Behavior: Behavior normal.     BP 121/67   Pulse 85   Ht 5\' 8"  (1.727 m)   Wt 236 lb (107 kg)   LMP 04/10/2011   SpO2 99%   BMI 35.88 kg/m  Wt Readings from Last 3 Encounters:  04/11/20 236 lb (107 kg)  12/29/19 232 lb 9.6 oz (105.5 kg)  05/24/19 233 lb (105.7 kg)    There are no preventive care reminders to display for this patient.  There are no preventive care reminders to display for this patient.   Lab Results  Component Value Date   TSH 2.036 12/17/2013   Lab Results  Component Value Date   WBC 5.2 12/29/2019   HGB 13.6 12/29/2019   HCT 40.3 12/29/2019   MCV 91.0 12/29/2019   PLT 244 12/29/2019   Lab Results  Component Value Date   NA 138 12/29/2019   K 4.4 12/29/2019   CO2 32 12/29/2019   GLUCOSE 83 12/29/2019   BUN 17 12/29/2019   CREATININE 0.69 12/29/2019   BILITOT 0.6 12/29/2019   ALKPHOS 82 11/01/2016   AST 16 12/29/2019   ALT 24 12/29/2019   PROT 7.6 12/29/2019   ALBUMIN 4.2 11/01/2016   CALCIUM 10.2 12/29/2019   Lab Results  Component Value Date   CHOL 253 (H) 12/29/2019   Lab Results  Component Value Date   HDL 49 (L) 12/29/2019   Lab Results  Component Value Date    LDLCALC  160 (H) 12/29/2019   Lab Results  Component Value Date   TRIG 269 (H) 12/29/2019   Lab Results  Component Value Date   CHOLHDL 5.2 (H) 12/29/2019   Lab Results  Component Value Date   HGBA1C 5.8 (A) 05/24/2019       Assessment & Plan:   Problem List Items Addressed This Visit    None    Visit Diagnoses    Right foot pain    -  Primary   Relevant Orders   DG Toe 4th Right   DG Foot Complete Right   Ambulatory referral to Podiatry   Pain in toe of right foot       Relevant Orders   DG Toe 4th Right   DG Foot Complete Right   Ambulatory referral to Podiatry     Distal right foot pain most consistent with possible neuroma.  We will go ahead and get an x-ray today.  She has already tried over-the-counter acid but recommend maybe a trial of meloxicam.  Referral to podiatry for further work-up and definitive treatment.  Also gave her metatarsal pads to provide some extra support of that distal arch and see if this is helpful as well.   No orders of the defined types were placed in this encounter.    Beatrice Lecher, MD

## 2020-04-21 ENCOUNTER — Other Ambulatory Visit: Payer: Self-pay | Admitting: Family Medicine

## 2020-04-28 ENCOUNTER — Ambulatory Visit: Payer: 59 | Admitting: Podiatry

## 2020-06-02 ENCOUNTER — Ambulatory Visit (INDEPENDENT_AMBULATORY_CARE_PROVIDER_SITE_OTHER): Payer: 59 | Admitting: Podiatry

## 2020-06-02 ENCOUNTER — Other Ambulatory Visit: Payer: Self-pay

## 2020-06-02 DIAGNOSIS — M79671 Pain in right foot: Secondary | ICD-10-CM

## 2020-06-02 DIAGNOSIS — D3610 Benign neoplasm of peripheral nerves and autonomic nervous system, unspecified: Secondary | ICD-10-CM

## 2020-06-02 MED ORDER — TRIAMCINOLONE ACETONIDE 10 MG/ML IJ SUSP
10.0000 mg | Freq: Once | INTRAMUSCULAR | Status: AC
  Administered 2020-06-02: 10 mg

## 2020-06-02 NOTE — Progress Notes (Signed)
Subjective:   Patient ID: Nicole Braun, female   DOB: 60 y.o.   MRN: [id number]   HPI 60 year old female presents the office today for concerns of a possible neuroma versus metatarsalgia on the right foot.  She states that she will occasionally get some discomfort but she started wearing more closed in shoes and flatter shoes she has noticed increased pain pointing to the third interspace and she also gets numbness of the third and fourth toes with the fourth toe worse.  She denies recent injury or falls.  She has tried a neuroma pad which has been helpful.  Occasional swelling.  No other concerns today.  She has an upcoming wedding in 2 weeks in [place]  All other systems reviewed and are negative.  Past Medical History:  Diagnosis Date  . Allergy    seasonal  . Hypertension     Past Surgical History:  Procedure Laterality Date  . CESAREAN SECTION  12/87 and 12/89  . [place]   right  . NOSE SURGERY  1994  . oral sx  1982     Current Outpatient Medications:  .  acetaminophen (TYLENOL) 650 MG CR tablet, Take 650 mg by mouth as needed for pain., Disp: , Rfl:  .  Ascorbic Acid (VITAMIN C) 100 MG tablet, Take 500 mg by mouth daily., Disp: , Rfl:  .  lisinopril-hydrochlorothiazide (ZESTORETIC) 10-12.5 MG tablet, Take 2 tablets by mouth daily., Disp: 180 tablet, Rfl: 1 .  loratadine (CLARITIN) 10 MG tablet, Take 10 mg by mouth daily.  , Disp: , Rfl:  .  VITAMIN D PO, Take by mouth., Disp: , Rfl:   Allergies  Allergen Reactions  . Codeine     Upset stomach  . Lidocaine     Makes feel shakey  . Simvastatin     REACTION: muscle aches.          Objective:  Physical Exam  General: AAO x3, NAD  Dermatological: Skin is warm, dry and supple bilateral. There are no open sores, no preulcerative lesions, no rash or signs of infection present.  Vascular: Dorsalis Pedis artery and Posterior Tibial artery pedal pulses are 2/4 bilateral with  immedate capillary fill time. There is no pain with calf compression, swelling, warmth, erythema.   Neruologic: Grossly intact via light touch bilateral. Negative tinel sign.   Musculoskeletal: There is tenderness palpation on the third interspace of the right foot there is a small palpable neuroma identified.  Upon palpation imaging metatarsal head she gets sharp pains in the third and fourth toes.  There is no area of pinpoint tenderness.  Minimal edema.  No erythema or warmth.  Muscular strength 5/5 in all groups tested bilateral.  Gait: Unassisted, Nonantalgic.       Assessment:   Right foot neuroma     Plan:  -Treatment options discussed including all alternatives, risks, and complications -Etiology of symptoms were discussed -Independently review the x-rays that she had performed previously. -Steroid injection.  See procedure note below. -Discussed shoe modifications and also neuroma pad was dispensed.  Also dispensed a gel metatarsal pad. -Discussed dehydrated alcohol injection if needed versus surgical intervention in the future.  Procedure: Injection Neuroma  Discussed alternatives, risks, complications and verbal consent was obtained.  Location: Right 3rd interspace  Skin Prep: Alcohol. Injectate: 0.5cc 0.5% marcaine plain, 0.5 cc 2% lidocaine plain and, 1 cc kenalog 10. Disposition: Patient tolerated procedure well. Injection site dressed with a  band-aid.  Post-injection care was discussed and return precautions discussed.   Return in about 6 weeks (around 07/14/2020), or if symptoms worsen or fail to improve.  Trula Slade DPM

## 2020-06-02 NOTE — Patient Instructions (Signed)

## 2020-06-09 ENCOUNTER — Ambulatory Visit: Payer: 59 | Admitting: Podiatry

## 2020-06-12 ENCOUNTER — Other Ambulatory Visit: Payer: Self-pay | Admitting: Family Medicine

## 2020-07-14 ENCOUNTER — Ambulatory Visit: Payer: 59 | Admitting: Podiatry

## 2020-07-27 ENCOUNTER — Ambulatory Visit (INDEPENDENT_AMBULATORY_CARE_PROVIDER_SITE_OTHER): Payer: 59 | Admitting: Family Medicine

## 2020-07-27 ENCOUNTER — Other Ambulatory Visit: Payer: Self-pay

## 2020-07-27 ENCOUNTER — Encounter: Payer: Self-pay | Admitting: Family Medicine

## 2020-07-27 VITALS — BP 119/76 | HR 76 | Ht 68.0 in | Wt 234.0 lb

## 2020-07-27 DIAGNOSIS — L989 Disorder of the skin and subcutaneous tissue, unspecified: Secondary | ICD-10-CM

## 2020-07-27 DIAGNOSIS — R058 Other specified cough: Secondary | ICD-10-CM | POA: Diagnosis not present

## 2020-07-27 NOTE — Progress Notes (Signed)
Acute Office Visit  Subjective:    Patient ID: Nicole Braun, female    DOB: 05/21/1961, 60 y.o.   MRN: [id number]  Chief Complaint  Patient presents with  . Follow-up    HPI Patient is in today for spot o n her neck there x 1 year.  Sister with basal skin cancer.  Not bothersome.  Occ hits with brush.    She also had a upper respiratory infection in about a month ago.  She says she is feeling well except for a lingering cough she says it has been gradually getting better.  Now just coughs maybe 2 or 3 times a day.  She also wakes up with a lot of phlegm in her throat in the mornings but says that is been ongoing for months even before she was sick.  Past Medical History:  Diagnosis Date  . Allergy    seasonal  . Hypertension     Past Surgical History:  Procedure Laterality Date  . CESAREAN SECTION  12/87 and 12/89  . [place]   right  . NOSE SURGERY  1994  . oral sx  1982    Family History  Problem Relation Age of Onset  . Stroke Father   . Parkinsonism Father   . Hypertension Father   . Diabetes Mother   . Hyperlipidemia Mother   . Hypertension Mother   . Cancer Sister        skin    Social History   Socioeconomic History  . Marital status: Married    Spouse name: Not on file  . Number of children: Not on file  . Years of education: Not on file  . Highest education level: Not on file  Occupational History  . Not on file  Tobacco Use  . Smoking status: Never Smoker  . Smokeless tobacco: Never Used  Substance and Sexual Activity  . Alcohol use: Yes    Comment: 1-3 per week  . Drug use: No  . Sexual activity: Not on file  Other Topics Concern  . Not on file  Social History Narrative  . Not on file   Social Determinants of Health   Financial Resource Strain: Not on file  Food Insecurity: Not on file  Transportation Needs: Not on file  Physical Activity: Not on file  Stress: Not on file  Social Connections: Not on file   Intimate Partner Violence: Not on file    Outpatient Medications Prior to Visit  Medication Sig Dispense Refill  . acetaminophen (TYLENOL) 650 MG CR tablet Take 650 mg by mouth as needed for pain.    . Ascorbic Acid (VITAMIN C) 100 MG tablet Take 500 mg by mouth daily.    Marland Kitchen azelastine (ASTELIN) 0.1 % nasal spray Place 2 sprays into both nostrils 2 (two) times daily. 30 mL 12  . lisinopril-hydrochlorothiazide (ZESTORETIC) 10-12.5 MG tablet Take 2 tablets by mouth daily. 180 tablet 1  . loratadine (CLARITIN) 10 MG tablet Take 10 mg by mouth daily.    . Multiple Vitamins-Minerals (ZINC PO) Take by mouth.    Marland Kitchen VITAMIN D PO Take by mouth.     No facility-administered medications prior to visit.    Allergies  Allergen Reactions  . Codeine     Upset stomach  . Lidocaine     Makes feel shakey  . Simvastatin     REACTION: muscle aches.    Review of Systems     Objective:  Physical Exam Constitutional:      Appearance: She is well-developed and well-nourished.  HENT:     Head: Normocephalic and atraumatic.  Cardiovascular:     Rate and Rhythm: Normal rate and regular rhythm.     Heart sounds: Normal heart sounds.  Pulmonary:     Effort: Pulmonary effort is normal.     Breath sounds: Normal breath sounds.  Skin:    General: Skin is warm and dry.     Comments: Skin lesion on back of scalp measure approx 0.8 x 07 cm , it is pink and papular and firm with small amt of dry scale.  No pearly appearance.    Neurological:     Mental Status: She is alert and oriented to person, place, and time.  Psychiatric:        Mood and Affect: Mood and affect normal.        Behavior: Behavior normal.     BP 119/76   Pulse 76   Ht 5\' 8"  (1.727 m)   Wt 234 lb (106.1 kg)   LMP 04/10/2011   SpO2 98%   BMI 35.58 kg/m  Wt Readings from Last 3 Encounters:  07/27/20 234 lb (106.1 kg)  04/11/20 236 lb (107 kg)  12/29/19 232 lb 9.6 oz (105.5 kg)    There are no preventive care reminders  to display for this patient.  There are no preventive care reminders to display for this patient.   Lab Results  Component Value Date   TSH 2.036 12/17/2013   Lab Results  Component Value Date   WBC 5.2 12/29/2019   HGB 13.6 12/29/2019   HCT 40.3 12/29/2019   MCV 91.0 12/29/2019   PLT 244 12/29/2019   Lab Results  Component Value Date   NA 138 12/29/2019   K 4.4 12/29/2019   CO2 32 12/29/2019   GLUCOSE 83 12/29/2019   BUN 17 12/29/2019   CREATININE 0.69 12/29/2019   BILITOT 0.6 12/29/2019   ALKPHOS 82 11/01/2016   AST 16 12/29/2019   ALT 24 12/29/2019   PROT 7.6 12/29/2019   ALBUMIN 4.2 11/01/2016   CALCIUM 10.2 12/29/2019   Lab Results  Component Value Date   CHOL 253 (H) 12/29/2019   Lab Results  Component Value Date   HDL 49 (L) 12/29/2019   Lab Results  Component Value Date   LDLCALC 160 (H) 12/29/2019   Lab Results  Component Value Date   TRIG 269 (H) 12/29/2019   Lab Results  Component Value Date   CHOLHDL 5.2 (H) 12/29/2019   Lab Results  Component Value Date   HGBA1C 5.8 (A) 05/24/2019       Assessment & Plan:   Problem List Items Addressed This Visit   None   Visit Diagnoses    Skin lesion    -  Primary   Post-viral cough syndrome         Skin lesion most consistent with either a seborrheic keratosis or actinic keratosis.  She has been picking at it so right now it feels very firm and most like it is trying to become more scarlike tissue.  We discussed possible treatment with shave biopsy for more definitive diagnosis versus cryotherapy.  And then if it returns after cryotherapy considering shave biopsy.  At this point in time she is going to watch and wait and let me know what treatment she would like to have I do feel reassured that it is not a basal cell or squamous  cell at least at this point based on today's features.  But will need to monitor carefully encouraged her to take a picture with her cell phone monthly for several months  just to monitor the area and to avoid scratching and picking at it.  Post viral cough-gave reassurance.  Lungs are clear on exam and she is actually feeling tremendously better.  Excess sputum overnight encouraged her to try using a humidifier in her bedroom at night.  No orders of the defined types were placed in this encounter.    Beatrice Lecher, MD

## 2020-08-03 ENCOUNTER — Encounter: Payer: Self-pay | Admitting: Family Medicine

## 2020-08-08 ENCOUNTER — Encounter: Payer: Self-pay | Admitting: Family Medicine

## 2020-08-08 NOTE — Progress Notes (Signed)
Negative for intraepithelial lesion or malignancy.  

## 2020-10-20 ENCOUNTER — Other Ambulatory Visit: Payer: Self-pay | Admitting: Family Medicine

## 2021-01-24 ENCOUNTER — Other Ambulatory Visit: Payer: Self-pay | Admitting: Family Medicine

## 2021-01-24 NOTE — Telephone Encounter (Signed)
Please call pt for f/u on bp and refills. She will also need and fasting labs. TY!

## 2021-01-24 NOTE — Telephone Encounter (Signed)
Pt scheduled an appt for 02/08/2021  @ 9:10. She knows about the fasting labs as well- tvt

## 2021-01-31 ENCOUNTER — Ambulatory Visit: Payer: 59 | Admitting: Medical-Surgical

## 2021-02-08 ENCOUNTER — Other Ambulatory Visit: Payer: Self-pay

## 2021-02-08 ENCOUNTER — Ambulatory Visit (INDEPENDENT_AMBULATORY_CARE_PROVIDER_SITE_OTHER): Payer: 59 | Admitting: Family Medicine

## 2021-02-08 VITALS — BP 125/70 | HR 75 | Ht 68.0 in | Wt 235.0 lb

## 2021-02-08 DIAGNOSIS — E785 Hyperlipidemia, unspecified: Secondary | ICD-10-CM

## 2021-02-08 DIAGNOSIS — R7301 Impaired fasting glucose: Secondary | ICD-10-CM

## 2021-02-08 DIAGNOSIS — R635 Abnormal weight gain: Secondary | ICD-10-CM | POA: Diagnosis not present

## 2021-02-08 DIAGNOSIS — I10 Essential (primary) hypertension: Secondary | ICD-10-CM

## 2021-02-08 DIAGNOSIS — K582 Mixed irritable bowel syndrome: Secondary | ICD-10-CM | POA: Insufficient documentation

## 2021-02-08 LAB — COMPLETE METABOLIC PANEL WITH GFR
AG Ratio: 2 (calc) (ref 1.0–2.5)
ALT: 19 U/L (ref 6–29)
AST: 17 U/L (ref 10–35)
Albumin: 4.7 g/dL (ref 3.6–5.1)
Alkaline phosphatase (APISO): 75 U/L (ref 37–153)
BUN: 24 mg/dL (ref 7–25)
CO2: 31 mmol/L (ref 20–32)
Calcium: 10 mg/dL (ref 8.6–10.4)
Chloride: 98 mmol/L (ref 98–110)
Creat: 0.65 mg/dL (ref 0.50–1.03)
Globulin: 2.4 g/dL (calc) (ref 1.9–3.7)
Glucose, Bld: 121 mg/dL — ABNORMAL HIGH (ref 65–99)
Potassium: 3.7 mmol/L (ref 3.5–5.3)
Sodium: 138 mmol/L (ref 135–146)
Total Bilirubin: 0.6 mg/dL (ref 0.2–1.2)
Total Protein: 7.1 g/dL (ref 6.1–8.1)
eGFR: 102 mL/min/{1.73_m2} (ref >=60)

## 2021-02-08 LAB — CBC
HCT: 39 % (ref 35.0–45.0)
Hemoglobin: 13.1 g/dL (ref 11.7–15.5)
MCH: 30.6 pg (ref 27.0–33.0)
MCHC: 33.6 g/dL (ref 32.0–36.0)
MCV: 91.1 fL (ref 80.0–100.0)
MPV: 10.2 fL (ref 7.5–12.5)
Platelets: 235 10*3/uL (ref 140–400)
RBC: 4.28 10*6/uL (ref 3.80–5.10)
RDW: 11.8 % (ref 11.0–15.0)
WBC: 4.5 10*3/uL (ref 3.8–10.8)

## 2021-02-08 LAB — LIPID PANEL W/REFLEX DIRECT LDL
Cholesterol: 257 mg/dL — ABNORMAL HIGH (ref <200)
HDL: 47 mg/dL — ABNORMAL LOW (ref >=50)
LDL Cholesterol (Calc): 169 mg/dL (calc) — ABNORMAL HIGH
Non-HDL Cholesterol (Calc): 210 mg/dL (calc) — ABNORMAL HIGH (ref <130)
Total CHOL/HDL Ratio: 5.5 (calc) — ABNORMAL HIGH (ref <5.0)
Triglycerides: 244 mg/dL — ABNORMAL HIGH (ref <150)

## 2021-02-08 LAB — TSH: TSH: 1.65 mIU/L (ref 0.40–4.50)

## 2021-02-08 NOTE — Progress Notes (Signed)
Established Patient Office Visit  Subjective:  Patient ID: Nicole Braun, female    DOB: 1960-07-09  Age: 60 y.o. MRN: [id number]  CC:  Chief Complaint  Patient presents with   Hypertension    HPI Nicole Braun presents for   Hypertension- Pt denies chest pain, SOB, dizziness, or heart palpitations.  Taking meds as directed w/o problems.  Denies medication side effects.    She also has some GI concerns she says starting about mid July she was with some friends over the weekend and had drank some alcohol and started having some heartburn and reflux symptoms which she does not frequently get.  She feels like since then she has had some on and off symptoms of diarrhea and constipation it seems to be fluctuating between the 2.  No nausea vomiting or blood in the stool.  No significant discomfort that she has had maybe 2 days where she actually had some pain 1 of those it was more left lower quadrant pain.  She said she did notice that eating fried foods seem to trigger diarrhea as well.  She will have diarrhea and then no bowel movement for few days and then she will be constipated she feels like there is very few regular/normal stools.   Past Medical History:  Diagnosis Date   Allergy    seasonal   Hypertension     Past Surgical History:  Procedure Laterality Date   CESAREAN SECTION  12/87 and 12/89   [place]   right   NOSE SURGERY  1994   oral sx  1982    Family History  Problem Relation Age of Onset   Stroke Father    Parkinsonism Father    Hypertension Father    Diabetes Mother    Hyperlipidemia Mother    Hypertension Mother    Cancer Sister        skin    Social History   Socioeconomic History   Marital status: Married    Spouse name: Not on file   Number of children: Not on file   Years of education: Not on file   Highest education level: Not on file  Occupational History   Not on file  Tobacco Use   Smoking status: Never    Smokeless tobacco: Never  Substance and Sexual Activity   Alcohol use: Yes    Comment: 1-3 per week   Drug use: No   Sexual activity: Not on file  Other Topics Concern   Not on file  Social History Narrative   Not on file   Social Determinants of Health   Financial Resource Strain: Not on file  Food Insecurity: Not on file  Transportation Needs: Not on file  Physical Activity: Not on file  Stress: Not on file  Social Connections: Not on file  Intimate Partner Violence: Not on file    Outpatient Medications Prior to Visit  Medication Sig Dispense Refill   acetaminophen (TYLENOL) 650 MG CR tablet Take 650 mg by mouth as needed for pain.     Ascorbic Acid (VITAMIN C) 100 MG tablet Take 500 mg by mouth daily.     azelastine (ASTELIN) 0.1 % nasal spray Place 2 sprays into both nostrils 2 (two) times daily. 30 mL 12   lisinopril-hydrochlorothiazide (ZESTORETIC) 10-12.5 MG tablet TAKE TWO TABLETS BY MOUTH EVERY DAY 180 tablet 0   loratadine (CLARITIN) 10 MG tablet Take 10 mg by mouth daily.     Multiple Vitamins-Minerals (  ZINC PO) Take by mouth.     VITAMIN D PO Take by mouth.     No facility-administered medications prior to visit.    Allergies  Allergen Reactions   Codeine     Upset stomach   Lidocaine     Makes feel shakey   Simvastatin     REACTION: muscle aches.    ROS Review of Systems    Objective:    Physical Exam Constitutional:      Appearance: Normal appearance. She is well-developed.  HENT:     Head: Normocephalic and atraumatic.  Cardiovascular:     Rate and Rhythm: Normal rate and regular rhythm.     Heart sounds: Normal heart sounds.  Pulmonary:     Effort: Pulmonary effort is normal.     Breath sounds: Normal breath sounds.  Abdominal:     General: Abdomen is flat. Bowel sounds are normal. There is no distension.     Palpations: Abdomen is soft.     Tenderness: There is no abdominal tenderness.  Skin:    General: Skin is warm and dry.   Neurological:     Mental Status: She is alert and oriented to person, place, and time.  Psychiatric:        Behavior: Behavior normal.    BP 125/70   Pulse 75   Ht '5\' 8"'$  (1.727 m)   Wt 235 lb (106.6 kg)   LMP 04/10/2011   SpO2 99%   BMI 35.73 kg/m  Wt Readings from Last 3 Encounters:  02/08/21 235 lb (106.6 kg)  07/27/20 234 lb (106.1 kg)  04/11/20 236 lb (107 kg)     There are no preventive care reminders to display for this patient.  There are no preventive care reminders to display for this patient.  Lab Results  Component Value Date   TSH 2.036 12/17/2013   Lab Results  Component Value Date   WBC 5.2 12/29/2019   HGB 13.6 12/29/2019   HCT 40.3 12/29/2019   MCV 91.0 12/29/2019   PLT 244 12/29/2019   Lab Results  Component Value Date   NA 138 12/29/2019   K 4.4 12/29/2019   CO2 32 12/29/2019   GLUCOSE 83 12/29/2019   BUN 17 12/29/2019   CREATININE 0.69 12/29/2019   BILITOT 0.6 12/29/2019   ALKPHOS 82 11/01/2016   AST 16 12/29/2019   ALT 24 12/29/2019   PROT 7.6 12/29/2019   ALBUMIN 4.2 11/01/2016   CALCIUM 10.2 12/29/2019   Lab Results  Component Value Date   CHOL 253 (H) 12/29/2019   Lab Results  Component Value Date   HDL 49 (L) 12/29/2019   Lab Results  Component Value Date   LDLCALC 160 (H) 12/29/2019   Lab Results  Component Value Date   TRIG 269 (H) 12/29/2019   Lab Results  Component Value Date   CHOLHDL 5.2 (H) 12/29/2019   Lab Results  Component Value Date   HGBA1C 5.8 (A) 05/24/2019      Assessment & Plan:   Problem List Items Addressed This Visit       Cardiovascular and Mediastinum   HYPERTENSION, BENIGN - Primary    Well controlled. Continue current regimen. Follow up in  49mo      Relevant Orders   Lipid Panel w/reflex Direct LDL   COMPLETE METABOLIC PANEL WITH GFR   CBC   TSH     Digestive   Irritable bowel syndrome with both constipation and diarrhea  Symptoms do seem consistent with IBS.  Change  in stools/possible IBS based on her description of symptoms.  I do not hear any other red flag symptoms at this point.  Colonoscopy is up-to-date but she is due again in about 2 years.  Discussed increasing fiber in her diet and discussed different products available over-the-counter market really not having a lot of pain or cramping or spasms which is reassuring.        Endocrine   IFG (impaired fasting glucose)    Due to recheck A1c its been almost 2 years since it was checked.  Hopefully it stable.  Lab Results  Component Value Date   HGBA1C 5.8 (A) 05/24/2019         Relevant Orders   Lipid Panel w/reflex Direct LDL   COMPLETE METABOLIC PANEL WITH GFR   CBC   TSH     Other   Hyperlipidemia   Relevant Orders   Lipid Panel w/reflex Direct LDL   COMPLETE METABOLIC PANEL WITH GFR   CBC   TSH   Other Visit Diagnoses     Weight gain       Relevant Orders   Lipid Panel w/reflex Direct LDL   COMPLETE METABOLIC PANEL WITH GFR   CBC   TSH        GERD-the reflux overall is infrequent maybe once a month.  So we discussed using like Tums over-the-counter if it comes more frequent and we can consider a PPI.  Also avoiding greasy, spicy foods, caffeinated foods and beverages and carbonated beverages would be helpful as well  No orders of the defined types were placed in this encounter.   Follow-up: Return in about 6 months (around 08/08/2021) for Hypertension.    Beatrice Lecher, MD

## 2021-02-08 NOTE — Assessment & Plan Note (Signed)
Due to recheck A1c its been almost 2 years since it was checked.  Hopefully it stable.  Lab Results  Component Value Date   HGBA1C 5.8 (A) 05/24/2019

## 2021-02-08 NOTE — Assessment & Plan Note (Signed)
Symptoms do seem consistent with IBS.  Change in stools/possible IBS based on her description of symptoms.  I do not hear any other red flag symptoms at this point.  Colonoscopy is up-to-date but she is due again in about 2 years.  Discussed increasing fiber in her diet and discussed different products available over-the-counter market really not having a lot of pain or cramping or spasms which is reassuring.

## 2021-02-08 NOTE — Progress Notes (Signed)
Pt reports that since about mid July she has had some indigestion but no abdominal pain. She has had some occasional diarrhea followed by constipation.  Tried Alka-Seltzer chews to help with this

## 2021-02-08 NOTE — Assessment & Plan Note (Signed)
Well controlled. Continue current regimen. Follow up in  6 mo  

## 2021-02-09 NOTE — Progress Notes (Signed)
Hi Nicole Braun, Your triglycerides are still elevated similar to last year.  They were as good as 160 about 4 years ago.  Just encourage you to continue to work on healthy diet and regular exercise.  Your LDL is also high at 169.  This puts your 10-year risk of cardiovascular disease around 4.8%.  Just again encouraged her to really work on getting that down.  Blood count and metabolic panel are okay.  Glucose also up a little bit.  Just make sure you are cutting back on sweets and carbs.  The 10-year ASCVD risk score (Arnett DK, et al., 2019) is: 4.8%   Values used to calculate the score:     Age: 60 years     Sex: Female     Is Non-Hispanic African American: No     Diabetic: No     Tobacco smoker: No     Systolic Blood Pressure: 0000000 mmHg     Is BP treated: Yes     HDL Cholesterol: 47 mg/dL     Total Cholesterol: 257 mg/dL

## 2021-03-29 ENCOUNTER — Other Ambulatory Visit: Payer: Self-pay | Admitting: Podiatry

## 2021-03-29 ENCOUNTER — Encounter: Payer: Self-pay | Admitting: Podiatry

## 2021-03-29 ENCOUNTER — Ambulatory Visit (INDEPENDENT_AMBULATORY_CARE_PROVIDER_SITE_OTHER): Payer: 59 | Admitting: Podiatry

## 2021-03-29 ENCOUNTER — Other Ambulatory Visit: Payer: Self-pay

## 2021-03-29 DIAGNOSIS — G5781 Other specified mononeuropathies of right lower limb: Secondary | ICD-10-CM

## 2021-03-29 DIAGNOSIS — M79671 Pain in right foot: Secondary | ICD-10-CM

## 2021-03-29 MED ORDER — DEXAMETHASONE SODIUM PHOSPHATE 120 MG/30ML IJ SOLN
4.0000 mg | Freq: Once | INTRAMUSCULAR | Status: AC
  Administered 2021-03-29: 4 mg via INTRA_ARTICULAR

## 2021-03-29 NOTE — Progress Notes (Signed)
  Subjective:  Patient ID: Nicole Braun, female    DOB: May 28, 1960,   MRN: [id number]  Chief Complaint  Patient presents with   Foot Pain    Patient is having right foot pain she states she think it is morton's neuroma , she states when she is in pain she rolls her foot around , relax and takes over the counter medication for relief.     60 y.o. female presents for follow-up of right foot neuroma. Patient was in the care of Dr. Jacqualyn Posey and has had previous injection that worked for 10 months. Relates wanting another injection . Denies any other pedal complaints. Denies n/v/f/c.   Past Medical History:  Diagnosis Date   Allergy    seasonal   Hypertension     Objective:  Physical Exam: Vascular: DP/PT pulses 2/4 bilateral. CFT <3 seconds. Normal hair growth on digits. No edema.  Skin. No lacerations or abrasions bilateral feet.  Musculoskeletal: MMT 5/5 bilateral lower extremities in DF, PF, Inversion and Eversion. Deceased ROM in DF of ankle joint. Tender over right third interspace and pain with compression. Positive mulders click.  Neurological: Sensation intact to light touch.   Assessment:   1. 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 Right foot pain      Plan:  Patient was evaluated and treated and all questions answered. Discussed neuroma and treatment options with patient.  Radiographs reviewed from previous.  Injection offered today. Patient in agreement. Procedure below.  Discussed padding and offloading today.  Patient to return as needed for future injections.     Lorenda Peck, DPM

## 2021-03-30 ENCOUNTER — Ambulatory Visit: Payer: 59 | Admitting: Podiatry

## 2021-04-05 ENCOUNTER — Telehealth: Payer: Self-pay | Admitting: *Deleted

## 2021-04-05 NOTE — Telephone Encounter (Signed)
Patient is calling because after recent injection on 03/29/21, injection has not helped but is hurting worse, wearing gel insert to help elevate pain.  How soon can you get another injection? She may be developing same problem(Morton's Neuroma)on the left foot as well. Please advise.

## 2021-04-09 NOTE — Telephone Encounter (Signed)
Patient is scheduled for 04/12/21 @8 :45-[place] office.

## 2021-04-12 ENCOUNTER — Ambulatory Visit: Payer: 59 | Admitting: Podiatry

## 2021-04-13 ENCOUNTER — Other Ambulatory Visit: Payer: Self-pay

## 2021-04-13 ENCOUNTER — Ambulatory Visit (INDEPENDENT_AMBULATORY_CARE_PROVIDER_SITE_OTHER): Payer: 59 | Admitting: Podiatry

## 2021-04-13 ENCOUNTER — Encounter: Payer: Self-pay | Admitting: Podiatry

## 2021-04-13 DIAGNOSIS — M79671 Pain in right foot: Secondary | ICD-10-CM

## 2021-04-13 DIAGNOSIS — G5781 Other specified mononeuropathies of right lower limb: Secondary | ICD-10-CM | POA: Diagnosis not present

## 2021-04-13 MED ORDER — TRIAMCINOLONE ACETONIDE 10 MG/ML IJ SUSP
10.0000 mg | Freq: Once | INTRAMUSCULAR | Status: AC
  Administered 2021-04-13: 10 mg

## 2021-04-13 NOTE — Progress Notes (Signed)
  Subjective:  Patient ID: Nicole Braun, female    DOB: 1960-06-28,   MRN: [id number]  Chief Complaint  Patient presents with   Foot Pain    The right foot is not any better and the injection may have missed the area and the left foot hurts in the same area as the right but may have been where I was compensate     60 y.o. female presents for follow-up of right foot neuroma. Relates injection did not help at all hoping for another. Relates she did have some pain in the left foot but it has improved since she made the appointment.  Denies any other pedal complaints. Denies n/v/f/c.   Past Medical History:  Diagnosis Date   Allergy    seasonal   Hypertension     Objective:  Physical Exam: Vascular: DP/PT pulses 2/4 bilateral. CFT <3 seconds. Normal hair growth on digits. No edema.  Skin. No lacerations or abrasions bilateral feet.  Musculoskeletal: MMT 5/5 bilateral lower extremities in DF, PF, Inversion and Eversion. Deceased ROM in DF of ankle joint. Tender over right third interspace and pain with compression. Positive mulders click.  Neurological: Sensation intact to light touch.   Assessment:   1. 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 Right foot pain      Plan:  Patient was evaluated and treated and all questions answered. Discussed neuroma and treatment options with patient.  Radiographs reviewed from previous.  Injection offered today. Patient in agreement. Procedure below.  Discussed padding and offloading today.  Patient to return as needed for future injections. If no relieve discussed MRI and possible surgery vs alcohol sclerosing injections.   Procedure: Injection Tendon/Ligament Discussed alternatives, risks, complications and verbal consent was obtained.  Location: Right third interspace. Skin Prep: Alcohol. Injectate: 1cc Kenalog 10 , 1 cc 2% lidocaine plain.  Disposition: Patient tolerated procedure well. Injection site dressed with a band-aid.   Post-injection care was discussed and return precautions discussed.    Lorenda Peck, DPM

## 2021-04-25 ENCOUNTER — Other Ambulatory Visit: Payer: Self-pay | Admitting: Family Medicine

## 2021-04-25 ENCOUNTER — Encounter: Payer: Self-pay | Admitting: Family Medicine

## 2021-04-28 IMAGING — DX DG FOOT COMPLETE 3+V*R*
3 series · 3 of 3 positions shown · non-contrast
Comparison: None.

CLINICAL DATA: Plantar right foot pain between the 3rd and 4th
metatarsal heads. Clinical concern for a Morton's neuroma.

EXAM:
RIGHT FOOT COMPLETE - 3+ VIEW

[foot ap]
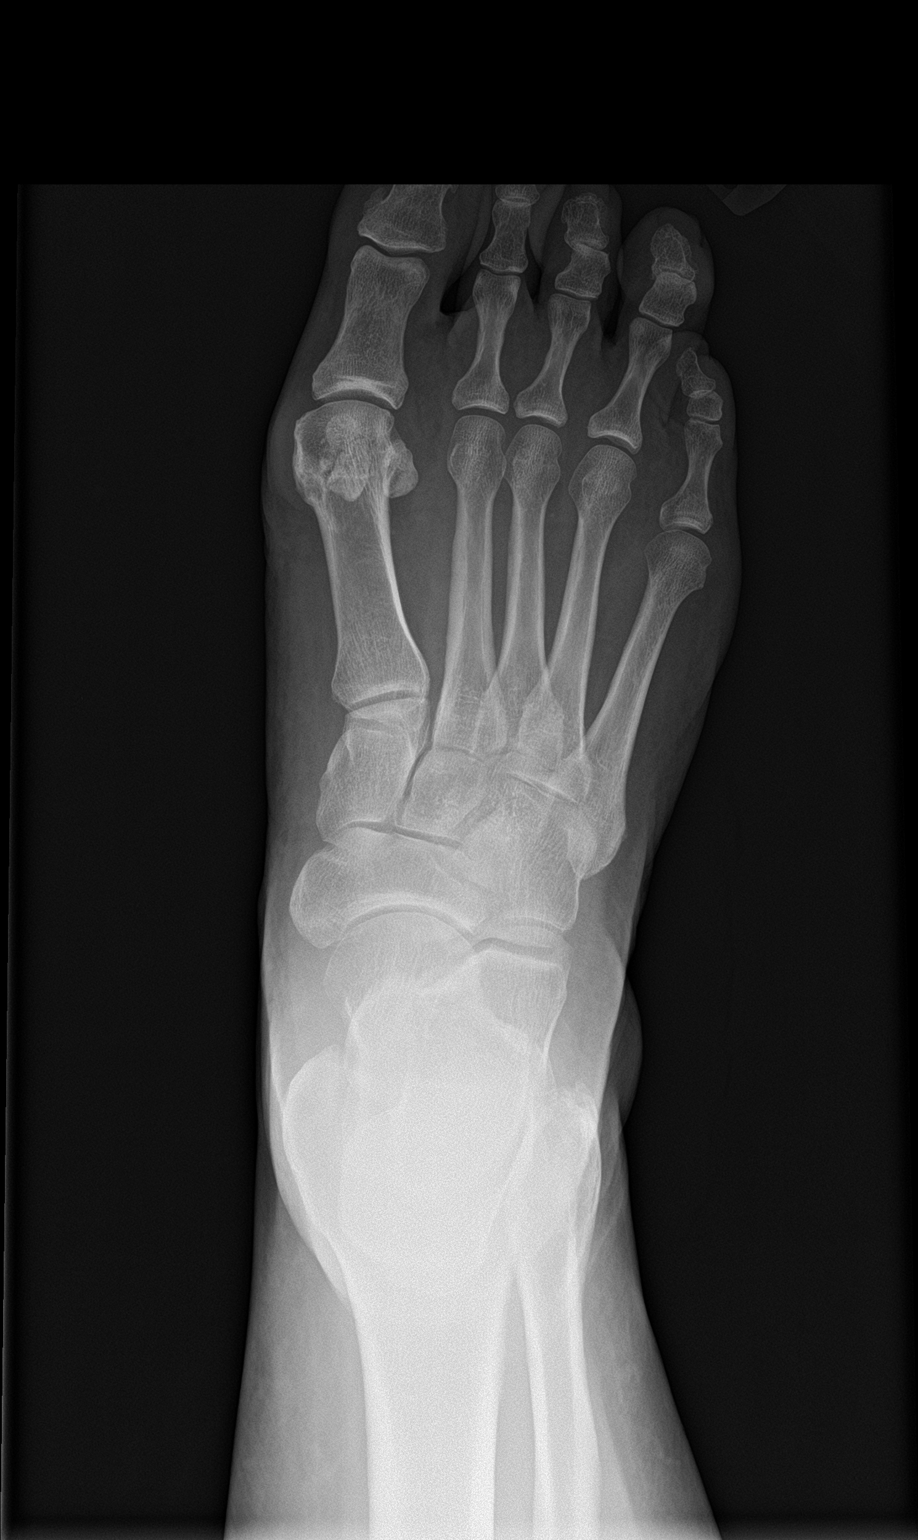

[foot obl]
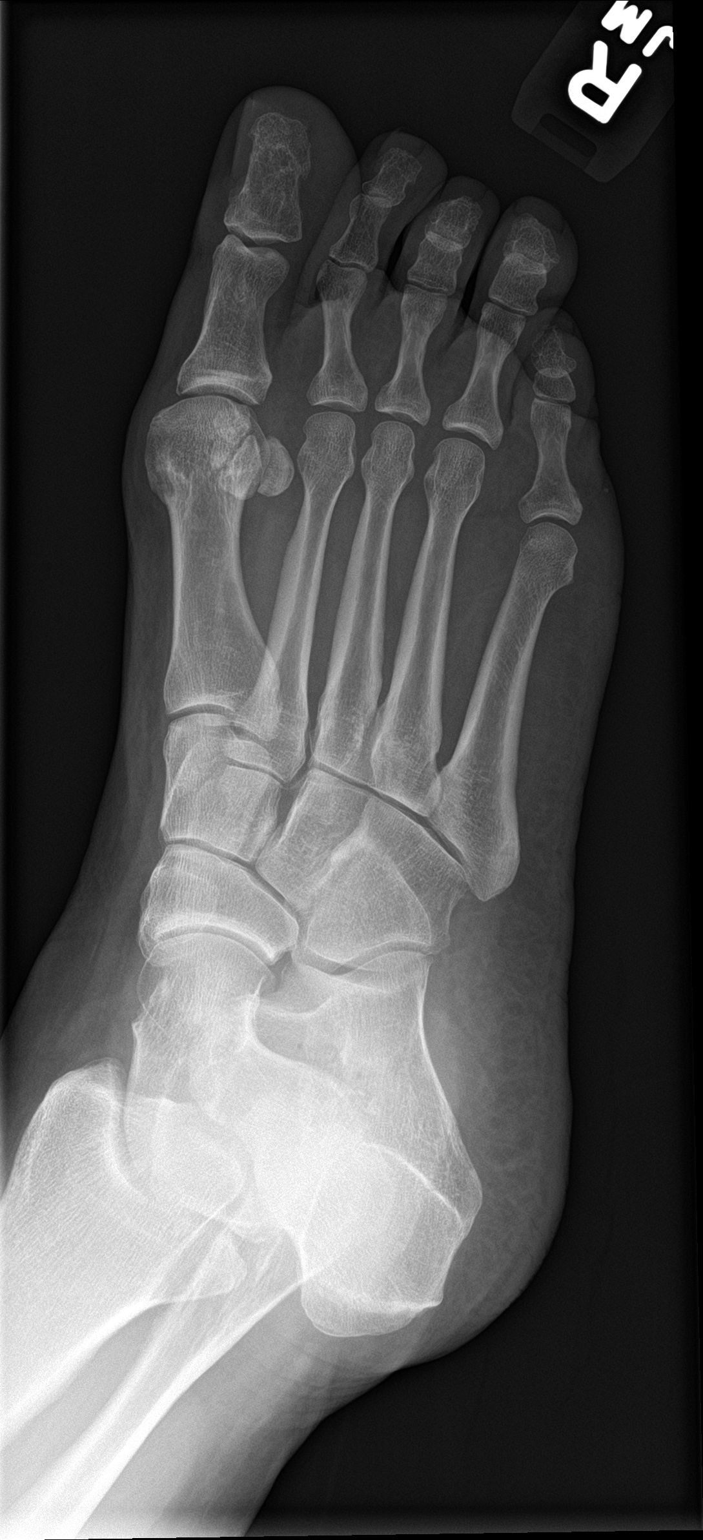

[foot lat]
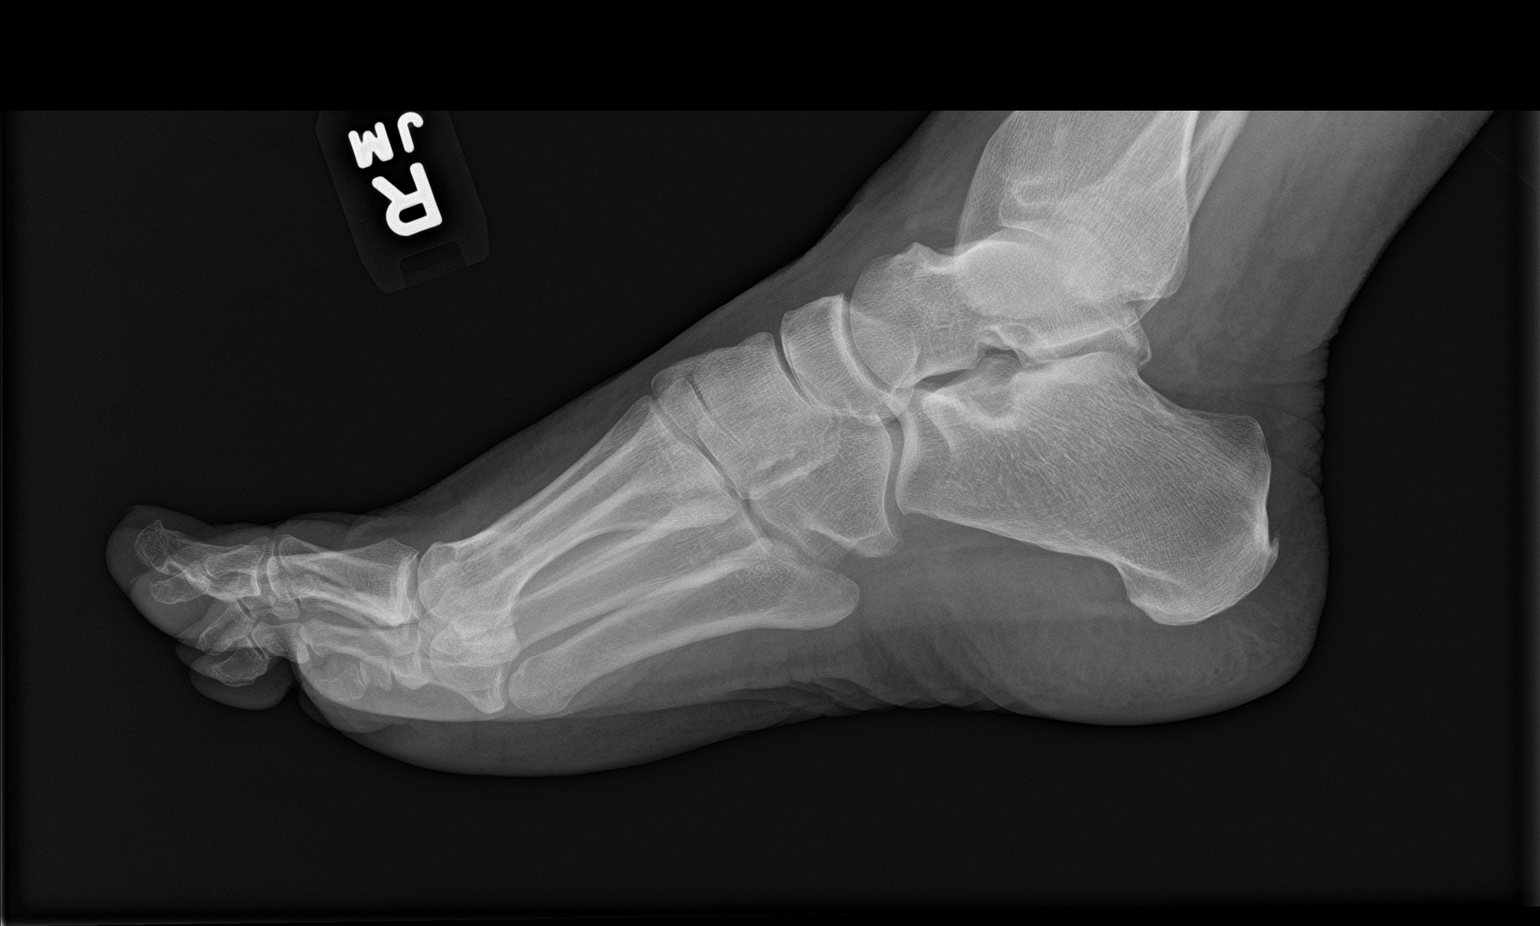

[3 of 3 positions shown; findings below may reference images not displayed]

FINDINGS: Mild posterior calcaneal enthesophyte formation. Otherwise, normal
appearing bones and soft tissues.
IMPRESSION: No significant abnormality. This does not exclude the possibility of
a Morton's neuroma. This could be best be determined with pre and
postcontrast MRI of the foot.

## 2021-07-25 ENCOUNTER — Other Ambulatory Visit: Payer: Self-pay | Admitting: Family Medicine

## 2021-07-26 MED ORDER — LISINOPRIL-HYDROCHLOROTHIAZIDE 10-12.5 MG PO TABS: 2.0000 | ORAL_TABLET | Freq: Every day | ORAL | 0 refills | Status: DC

## 2021-08-08 ENCOUNTER — Encounter: Payer: Self-pay | Admitting: Family Medicine

## 2021-08-08 ENCOUNTER — Other Ambulatory Visit: Payer: Self-pay

## 2021-08-08 ENCOUNTER — Ambulatory Visit (INDEPENDENT_AMBULATORY_CARE_PROVIDER_SITE_OTHER): Payer: 59 | Admitting: Family Medicine

## 2021-08-08 VITALS — BP 115/69 | HR 72 | Ht 68.0 in | Wt 235.0 lb

## 2021-08-08 DIAGNOSIS — M79652 Pain in left thigh: Secondary | ICD-10-CM

## 2021-08-08 DIAGNOSIS — R7301 Impaired fasting glucose: Secondary | ICD-10-CM

## 2021-08-08 DIAGNOSIS — R1011 Right upper quadrant pain: Secondary | ICD-10-CM | POA: Diagnosis not present

## 2021-08-08 DIAGNOSIS — I10 Essential (primary) hypertension: Secondary | ICD-10-CM

## 2021-08-08 LAB — POCT GLYCOSYLATED HEMOGLOBIN (HGB A1C): Hemoglobin A1C: 6.2 % — AB (ref 4.0–5.6)

## 2021-08-08 MED ORDER — LISINOPRIL-HYDROCHLOROTHIAZIDE 10-12.5 MG PO TABS: 2.0000 | ORAL_TABLET | Freq: Every day | ORAL | 1 refills | Status: DC

## 2021-08-08 NOTE — Assessment & Plan Note (Signed)
Well controlled. Continue current regimen. Follow up in  6 mo due for BMP ?

## 2021-08-08 NOTE — Assessment & Plan Note (Signed)
A1c went up slightly to 6.2 compared to prior 5.8.  We discussed some strategies for diet and exercise and to follow-up in 3 to 4 months.  At that point if the A1c is still close to 6-1/2 we could consider metformin. ?

## 2021-08-08 NOTE — Progress Notes (Signed)
? ?Established Patient Office Visit ? ?Subjective:  ?Patient ID: Nicole Braun, female    DOB: 02/11/1961  Age: 61 y.o. MRN: [id number] ? ?CC:  ?Chief Complaint  ?Patient presents with  ? Hypertension  ? ? ?HPI ?Nicole Braun presents for  ? ?Hypertension- Pt denies chest pain, SOB, dizziness, or heart palpitations.  Taking meds as directed w/o problems.  Denies medication side effects.   ? ?Impaired fasting glucose-no increased thirst or urination. No symptoms consistent with hypoglycemia. ? ?She said she is still struggling with her weight but feels like she has the tools to make some changes.  At one point she actually lost 60 pounds with weight watchers. ? ?She also woke up Saturday night around 3 AM suddenly feeling like she was having stomach cramping some right upper quadrant pain underneath that right breast and feeling sweaty and lightheaded.  She had about 3 episodes of diarrhea the whole thing lasted about 20 minutes and then she was able to lie back down and feel better she had had a really heavy steak dinner that night.  She says that the right upper quadrant pain that lasted all the way into the next day. Fam hx of gallbladder issues. ? ?She has also has been having some left thigh pain for about 2 weeks she says it really feels like it is deep in the bone and it does not feel like it superficial or in the muscle.  She says she will just keep an eye on it but let me know if it continues she said she feels like she is been having some aches and pains that are just moving around in general.  She wonders if some of it could be hormonal. ? ?Past Medical History:  ?Diagnosis Date  ? Allergy   ? seasonal  ? Hypertension   ? ? ?Past Surgical History:  ?Procedure Laterality Date  ? CESAREAN SECTION  12/87 and 12/89  ? KNEE SURGERY  1994  ? right  ? NOSE SURGERY  1994  ? oral sx  1982  ? ? ?Family History  ?Problem Relation Age of Onset  ? Stroke Father   ? Parkinsonism Father   ? Hypertension Father    ? Diabetes Mother   ? Hyperlipidemia Mother   ? Hypertension Mother   ? Cancer Sister   ?     skin  ? ? ?Social History  ? ?Socioeconomic History  ? Marital status: Married  ?  Spouse name: Not on file  ? Number of children: Not on file  ? Years of education: Not on file  ? Highest education level: Not on file  ?Occupational History  ? Not on file  ?Tobacco Use  ? Smoking status: Never  ? Smokeless tobacco: Never  ?Substance and Sexual Activity  ? Alcohol use: Yes  ?  Comment: 1-3 per week  ? Drug use: No  ? Sexual activity: Not on file  ?Other Topics Concern  ? Not on file  ?Social History Narrative  ? Not on file  ? ?Social Determinants of Health  ? ?Financial Resource Strain: Not on file  ?Food Insecurity: Not on file  ?Transportation Needs: Not on file  ?Physical Activity: Not on file  ?Stress: Not on file  ?Social Connections: Not on file  ?Intimate Partner Violence: Not on file  ? ? ?Outpatient Medications Prior to Visit  ?Medication Sig Dispense Refill  ? NON FORMULARY Take 50 mg by mouth daily. Zinc 50 mg daily    ?   acetaminophen (TYLENOL) 650 MG CR tablet Take 650 mg by mouth as needed for pain.    ? Ascorbic Acid (VITAMIN C) 100 MG tablet Take 500 mg by mouth daily.    ? azelastine (ASTELIN) 0.1 % nasal spray Place 2 sprays into both nostrils 2 (two) times daily. 30 mL 12  ? loratadine (CLARITIN) 10 MG tablet Take 10 mg by mouth daily.    ? VITAMIN D PO Take by mouth.    ? lisinopril-hydrochlorothiazide (ZESTORETIC) 10-12.5 MG tablet Take 2 tablets by mouth daily. 180 tablet 0  ? Multiple Vitamins-Minerals (ZINC PO) Take by mouth.    ? ?No facility-administered medications prior to visit.  ? ? ?Allergies  ?Allergen Reactions  ? Codeine   ?  Upset stomach  ? Lidocaine   ?  Makes feel shakey  ? Simvastatin   ?  REACTION: muscle aches.  ? ? ?ROS ?Review of Systems ? ?  ?Objective:  ?  ?Physical Exam ?Constitutional:   ?   Appearance: Normal appearance. She is well-developed.  ?HENT:  ?   Head: Normocephalic  and atraumatic.  ?Cardiovascular:  ?   Rate and Rhythm: Normal rate and regular rhythm.  ?   Heart sounds: Normal heart sounds.  ?Pulmonary:  ?   Effort: Pulmonary effort is normal.  ?   Breath sounds: Normal breath sounds.  ?Skin: ?   General: Skin is warm and dry.  ?Neurological:  ?   Mental Status: She is alert and oriented to person, place, and time.  ?Psychiatric:     ?   Behavior: Behavior normal.  ? ? ?BP 115/69   Pulse 72   Ht 5' 8" (1.727 m)   Wt 235 lb (106.6 kg)   LMP 04/10/2011   SpO2 98%   BMI 35.73 kg/m?  ?Wt Readings from Last 3 Encounters:  ?08/08/21 235 lb (106.6 kg)  ?02/08/21 235 lb (106.6 kg)  ?07/27/20 234 lb (106.1 kg)  ? ? ? ?Health Maintenance Due  ?Topic Date Due  ? COVID-19 Vaccine (1) Never done  ? HIV Screening  Never done  ? ? ?There are no preventive care reminders to display for this patient. ? ?Lab Results  ?Component Value Date  ? TSH 1.65 02/08/2021  ? ?Lab Results  ?Component Value Date  ? WBC 4.5 02/08/2021  ? HGB 13.1 02/08/2021  ? HCT 39.0 02/08/2021  ? MCV 91.1 02/08/2021  ? PLT 235 02/08/2021  ? ?Lab Results  ?Component Value Date  ? NA 138 02/08/2021  ? K 3.7 02/08/2021  ? CO2 31 02/08/2021  ? GLUCOSE 121 (H) 02/08/2021  ? BUN 24 02/08/2021  ? CREATININE 0.65 02/08/2021  ? BILITOT 0.6 02/08/2021  ? ALKPHOS 82 11/01/2016  ? AST 17 02/08/2021  ? ALT 19 02/08/2021  ? PROT 7.1 02/08/2021  ? ALBUMIN 4.2 11/01/2016  ? CALCIUM 10.0 02/08/2021  ? EGFR 102 02/08/2021  ? ?Lab Results  ?Component Value Date  ? CHOL 257 (H) 02/08/2021  ? ?Lab Results  ?Component Value Date  ? HDL 47 (L) 02/08/2021  ? ?Lab Results  ?Component Value Date  ? LDLCALC 169 (H) 02/08/2021  ? ?Lab Results  ?Component Value Date  ? TRIG 244 (H) 02/08/2021  ? ?Lab Results  ?Component Value Date  ? CHOLHDL 5.5 (H) 02/08/2021  ? ?Lab Results  ?Component Value Date  ? HGBA1C 6.2 (A) 08/08/2021  ? ? ?  ?Assessment & Plan:  ? ?Problem List Items Addressed This Visit   ? ?  ?  Cardiovascular and Mediastinum  ?  HYPERTENSION, BENIGN - Primary  ?  Well controlled. Continue current regimen. Follow up in  6 mo due for BMP ?  ?  ? Relevant Medications  ? lisinopril-hydrochlorothiazide (ZESTORETIC) 10-12.5 MG tablet  ? Other Relevant Orders  ? BASIC METABOLIC PANEL WITH GFR  ?  ? Endocrine  ? IFG (impaired fasting glucose)  ?  A1c went up slightly to 6.2 compared to prior 5.8.  We discussed some strategies for diet and exercise and to follow-up in 3 to 4 months.  At that point if the A1c is still close to 6-1/2 we could consider metformin. ?  ?  ? Relevant Orders  ? BASIC METABOLIC PANEL WITH GFR  ? POCT glycosylated hemoglobin (Hb A1C) (Completed)  ? ?Other Visit Diagnoses   ? ? RUQ pain      ? Left thigh pain      ? ?  ? ? ?RUQ pain -pain has resolved currently we did discuss options including getting an ultrasound.  If this is only episode she is really had similar to this.  We discussed that if it happens again I definitely want to get an ultrasound for further work-up.  And we can also consider getting bilirubin and liver enzymes at that time as well. ? ?Left thigh pain-discussed that if it continues over the next couple weeks I really like to get an x-ray and maybe do a CBC for further evaluation.  It does not seem to be muscular ? ?Meds ordered this encounter  ?Medications  ? lisinopril-hydrochlorothiazide (ZESTORETIC) 10-12.5 MG tablet  ?  Sig: Take 2 tablets by mouth daily.  ?  Dispense:  180 tablet  ?  Refill:  1  ? ? ?Follow-up: Return in about 4 months (around 12/08/2021) for pre-diabetes .  ? ? ?Catherine Metheney, MD ?

## 2021-08-09 LAB — BASIC METABOLIC PANEL WITH GFR
BUN: 17 mg/dL (ref 7–25)
CO2: 30 mmol/L (ref 20–32)
Calcium: 10 mg/dL (ref 8.6–10.4)
Chloride: 99 mmol/L (ref 98–110)
Creat: 0.74 mg/dL (ref 0.50–1.03)
Glucose, Bld: 113 mg/dL — ABNORMAL HIGH (ref 65–99)
Potassium: 4.3 mmol/L (ref 3.5–5.3)
Sodium: 140 mmol/L (ref 135–146)
eGFR: 93 mL/min/{1.73_m2} (ref >=60)

## 2021-08-09 NOTE — Progress Notes (Signed)
Your lab work is within acceptable range and there are no concerning findings.   ?

## 2021-08-29 DIAGNOSIS — Z01419 Encounter for gynecological examination (general) (routine) without abnormal findings: Secondary | ICD-10-CM | POA: Diagnosis not present

## 2021-08-29 DIAGNOSIS — Z1231 Encounter for screening mammogram for malignant neoplasm of breast: Secondary | ICD-10-CM | POA: Diagnosis not present

## 2021-08-29 DIAGNOSIS — Z124 Encounter for screening for malignant neoplasm of cervix: Secondary | ICD-10-CM | POA: Diagnosis not present

## 2021-08-29 LAB — HM MAMMOGRAPHY

## 2021-09-07 ENCOUNTER — Encounter: Payer: Self-pay | Admitting: Family Medicine

## 2021-09-12 DIAGNOSIS — M79645 Pain in left finger(s): Secondary | ICD-10-CM | POA: Diagnosis not present

## 2021-09-17 LAB — HM PAP SMEAR: HM Pap smear: NEGATIVE

## 2021-10-03 ENCOUNTER — Telehealth: Payer: Self-pay | Admitting: *Deleted

## 2021-10-03 NOTE — Telephone Encounter (Signed)
Pt has a grand daughter that is less than 51 month old. Wanted to know if she needed the Tdap. Pt advised that she would.  ? ?She will call back to schedule this.  ?

## 2021-10-05 ENCOUNTER — Encounter: Payer: Self-pay | Admitting: Family Medicine

## 2021-10-05 NOTE — Progress Notes (Signed)
Negative for intraepithelial lesion or malignancy.  

## 2021-12-13 ENCOUNTER — Encounter: Payer: Self-pay | Admitting: Family Medicine

## 2021-12-13 ENCOUNTER — Ambulatory Visit (INDEPENDENT_AMBULATORY_CARE_PROVIDER_SITE_OTHER): Payer: 59 | Admitting: Family Medicine

## 2021-12-13 VITALS — BP 133/71 | HR 97 | Ht 68.0 in | Wt 206.0 lb

## 2021-12-13 DIAGNOSIS — Z6826 Body mass index (BMI) 26.0-26.9, adult: Secondary | ICD-10-CM | POA: Insufficient documentation

## 2021-12-13 DIAGNOSIS — R7301 Impaired fasting glucose: Secondary | ICD-10-CM

## 2021-12-13 DIAGNOSIS — I10 Essential (primary) hypertension: Secondary | ICD-10-CM | POA: Diagnosis not present

## 2021-12-13 DIAGNOSIS — Z6830 Body mass index (BMI) 30.0-30.9, adult: Secondary | ICD-10-CM | POA: Diagnosis not present

## 2021-12-13 LAB — POCT GLYCOSYLATED HEMOGLOBIN (HGB A1C): Hemoglobin A1C: 6.1 % — AB (ref 4.0–5.6)

## 2021-12-13 NOTE — Assessment & Plan Note (Signed)
Doing well. Goal to get down to 175 lbs.

## 2021-12-13 NOTE — Progress Notes (Signed)
   Established Patient Office Visit  Subjective   Patient ID: Nicole Braun, female    DOB: February 26, 1961  Age: 61 y.o. MRN: [id number]  Chief Complaint  Patient presents with   ifg   Hypertension    HPI  Hypertension- Pt denies chest pain, SOB, dizziness, or heart palpitations.  Taking meds as directed w/o problems.  Denies medication side effects.    Impaired fasting glucose-no increased thirst or urination. No symptoms consistent with hypoglycemia.     ROS    Objective:     Ht '5\' 8"'$  (1.727 m)   LMP 04/10/2011   BMI 35.73 kg/m    Physical Exam   No results found for any visits on 12/13/21.    The 10-year ASCVD risk score (Arnett DK, et al., 2019) is: 4.8%    Assessment & Plan:   Problem List Items Addressed This Visit       Cardiovascular and Mediastinum   HYPERTENSION, BENIGN     Endocrine   IFG (impaired fasting glucose) - Primary   Relevant Orders   POCT glycosylated hemoglobin (Hb A1C)    No follow-ups on file.    Beatrice Lecher, MD

## 2021-12-13 NOTE — Assessment & Plan Note (Signed)
Well controlled. Continue current regimen. Follow up in  6 mo  

## 2021-12-13 NOTE — Progress Notes (Signed)
   Established Patient Office Visit  Subjective   Patient ID: Nicole Braun, female    DOB: [birth year]  Age: 61 y.o. MRN: [id number]  Chief Complaint  Patient presents with   ifg   Hypertension    HPI  Hypertension- Pt denies chest pain, SOB, dizziness, or heart palpitations.  Taking meds as directed w/o problems.  Denies medication side effects.    Impaired fasting glucose-no increased thirst or urination. No symptoms consistent with hypoglycemia.  She is really done a phenomenal job.  She is lost about 30 pounds.  She has been using my fitness pal to help her track calories and get back on track.  She is also been try to walk about 10,000 steps per day.  Goal is to get down to about 175.  Doing well overall. She is working part-time and watching her grandkids part time.     ROS    Objective:     BP 133/71 (BP Location: Right Arm)   Pulse 97   Ht '5\' 8"'$  (1.727 m)   Wt 206 lb (93.4 kg)   LMP 04/10/2011   SpO2 96%   BMI 31.32 kg/m    Physical Exam Vitals and nursing note reviewed.  Constitutional:      Appearance: She is well-developed.  HENT:     Head: Normocephalic and atraumatic.  Cardiovascular:     Rate and Rhythm: Normal rate and regular rhythm.     Heart sounds: Normal heart sounds.  Pulmonary:     Effort: Pulmonary effort is normal.     Breath sounds: Normal breath sounds.  Skin:    General: Skin is warm and dry.  Neurological:     Mental Status: She is alert and oriented to person, place, and time.  Psychiatric:        Behavior: Behavior normal.      Results for orders placed or performed in visit on 12/13/21  POCT glycosylated hemoglobin (Hb A1C)  Result Value Ref Range   Hemoglobin A1C 6.1 (A) 4.0 - 5.6 %   HbA1c POC (<> result, manual entry)     HbA1c, POC (prediabetic range)     HbA1c, POC (controlled diabetic range)        The 10-year ASCVD risk score (Arnett DK, et al., 2019) is: 5.9%    Assessment & Plan:   Problem List  Items Addressed This Visit       Cardiovascular and Mediastinum   HYPERTENSION, BENIGN    Well controlled. Continue current regimen. Follow up in  6 mo       Relevant Orders   BASIC METABOLIC PANEL WITH GFR     Endocrine   IFG (impaired fasting glucose) - Primary    Great job on weight loss!!!!  Well controlled. Continue current regimen. Follow up in  6 mo       Relevant Orders   POCT glycosylated hemoglobin (Hb A1C) (Completed)   BASIC METABOLIC PANEL WITH GFR     Other   BMI 30.0-30.9,adult    Doing well. Goal to get down to 175 lbs.       Return in about 6 months (around 06/15/2022) for Hypertension and A1C.    Beatrice Lecher, MD

## 2021-12-13 NOTE — Assessment & Plan Note (Addendum)
Great job on weight loss!!!!  Well controlled. Continue current regimen. Follow up in  6 mo

## 2022-01-15 DIAGNOSIS — I10 Essential (primary) hypertension: Secondary | ICD-10-CM | POA: Diagnosis not present

## 2022-01-15 DIAGNOSIS — R7301 Impaired fasting glucose: Secondary | ICD-10-CM | POA: Diagnosis not present

## 2022-01-16 LAB — BASIC METABOLIC PANEL WITH GFR
BUN: 19 mg/dL (ref 7–25)
CO2: 29 mmol/L (ref 20–32)
Calcium: 10 mg/dL (ref 8.6–10.4)
Chloride: 101 mmol/L (ref 98–110)
Creat: 0.84 mg/dL (ref 0.50–1.03)
Glucose, Bld: 113 mg/dL — ABNORMAL HIGH (ref 65–99)
Potassium: 4.1 mmol/L (ref 3.5–5.3)
Sodium: 139 mmol/L (ref 135–146)
eGFR: 80 mL/min/{1.73_m2} (ref >=60)

## 2022-01-16 NOTE — Progress Notes (Signed)
Your lab work is within acceptable range and there are no concerning findings.   ?

## 2022-03-15 ENCOUNTER — Ambulatory Visit (INDEPENDENT_AMBULATORY_CARE_PROVIDER_SITE_OTHER): Payer: 59 | Admitting: Podiatrist

## 2022-03-15 DIAGNOSIS — Z5321 Procedure and treatment not carried out due to patient leaving prior to being seen by health care provider: Secondary | ICD-10-CM

## 2022-03-15 NOTE — Progress Notes (Signed)
Nicole Braun had to leave as she was here on her lunch break and was unable to wait any longer. She will call back if she would like to reschedule at a future date.

## 2022-03-29 ENCOUNTER — Other Ambulatory Visit: Payer: Self-pay | Admitting: Family Medicine

## 2022-06-18 ENCOUNTER — Encounter: Payer: Self-pay | Admitting: Family Medicine

## 2022-06-18 ENCOUNTER — Ambulatory Visit: Payer: 59 | Admitting: Family Medicine

## 2022-06-18 VITALS — BP 147/73 | HR 75 | Ht 68.0 in | Wt 195.0 lb

## 2022-06-18 DIAGNOSIS — R7301 Impaired fasting glucose: Secondary | ICD-10-CM | POA: Diagnosis not present

## 2022-06-18 DIAGNOSIS — J069 Acute upper respiratory infection, unspecified: Secondary | ICD-10-CM | POA: Diagnosis not present

## 2022-06-18 DIAGNOSIS — J020 Streptococcal pharyngitis: Secondary | ICD-10-CM

## 2022-06-18 DIAGNOSIS — I10 Essential (primary) hypertension: Secondary | ICD-10-CM

## 2022-06-18 DIAGNOSIS — J029 Acute pharyngitis, unspecified: Secondary | ICD-10-CM

## 2022-06-18 LAB — POCT RAPID STREP A (OFFICE): Rapid Strep A Screen: POSITIVE — AB

## 2022-06-18 LAB — POC COVID19 BINAXNOW: SARS Coronavirus 2 Ag: NEGATIVE

## 2022-06-18 MED ORDER — PENICILLIN V POTASSIUM 500 MG PO TABS: 500.0000 mg | ORAL_TABLET | Freq: Two times a day (BID) | ORAL | 0 refills | Status: AC

## 2022-06-18 MED ORDER — FLUCONAZOLE 150 MG PO TABS: 150.0000 mg | ORAL_TABLET | Freq: Once | ORAL | 0 refills | Status: AC

## 2022-06-18 NOTE — Progress Notes (Unsigned)
Established Patient Office Visit  Subjective   Patient ID: Nicole Braun, female    DOB: Sep 08, 1960  Age: 62 y.o. MRN: [id number]  Chief Complaint  Patient presents with   Hypertension    HPI  Hypertension- Pt denies chest pain, SOB, dizziness, or heart palpitations.  Taking meds as directed w/o problems.  Denies medication side effects.  Decreased her medication to 1 tab daily and says that at home her blood pressures have been well-controlled.  She reports average blood pressure has been around 130/78.  Impaired fasting glucose-no increased thirst or urination. No symptoms consistent with hypoglycemia.  He is lost 11 pounds since she was last here.  She did gain back a little bit of weight over the holidays but has already lost it.  She has not been exercising as consistently.  Says she also noticed that she was having some postnasal drip for the last 3 to 4 days and then last night started to get a very sore throat cough and ran a low-grade temperature.  This morning her throat feels a little bit better but she still had an intermittent cough today.  She has been around her granddaughter who has been sick as well.  No fever today.   {History (Optional):23778}  ROS    Objective:     BP (!) 141/68   Pulse 81   Ht '5\' 8"'$  (1.727 m)   Wt 195 lb (88.5 kg)   LMP 04/10/2011   SpO2 99%   BMI 29.65 kg/m  {Vitals History (Optional):23777}  Physical Exam Vitals and nursing note reviewed.  Constitutional:      Appearance: She is well-developed.  HENT:     Head: Normocephalic and atraumatic.     Right Ear: Tympanic membrane, ear canal and external ear normal.     Left Ear: Tympanic membrane, ear canal and external ear normal.     Nose: Nose normal.     Mouth/Throat:     Pharynx: Posterior oropharyngeal erythema present. No oropharyngeal exudate.  Eyes:     Conjunctiva/sclera: Conjunctivae normal.     Pupils: Pupils are equal, round, and reactive to light.  Neck:      Thyroid: No thyromegaly.  Cardiovascular:     Rate and Rhythm: Normal rate and regular rhythm.     Heart sounds: Normal heart sounds.  Pulmonary:     Effort: Pulmonary effort is normal.     Breath sounds: Normal breath sounds. No wheezing.  Musculoskeletal:     Cervical back: Neck supple.  Lymphadenopathy:     Cervical: No cervical adenopathy.  Skin:    General: Skin is warm and dry.  Neurological:     Mental Status: She is alert and oriented to person, place, and time.  Psychiatric:        Behavior: Behavior normal.      No results found for any visits on 06/18/22.  {Labs (Optional):23779}  The 10-year ASCVD risk score (Arnett DK, et al., 2019) is: 7.2%    Assessment & Plan:   Problem List Items Addressed This Visit       Cardiovascular and Mediastinum   HYPERTENSION, BENIGN - Primary    Discussed goal of getting 80% of the blood sugars under 130 to be within guidelines.  She will try to monitor over the next couple of months.      Relevant Orders   Lipid Panel w/reflex Direct LDL   COMPLETE METABOLIC PANEL WITH GFR   HgB A1c  Endocrine   IFG (impaired fasting glucose)    A1c up to 6.5 today.  She is now back on track with her eating.  But we also discussed getting back on track with exercise as well and then planning to recheck A1c in about 4 months.      Relevant Orders   Lipid Panel w/reflex Direct LDL   COMPLETE METABOLIC PANEL WITH GFR   HgB A1c   POCT glycosylated hemoglobin (Hb A1C)   Other Visit Diagnoses     Sore throat       Viral upper respiratory tract infection       Relevant Medications   fluconazole (DIFLUCAN) 150 MG tablet   Strep throat       Relevant Medications   penicillin v potassium (VEETID) 500 MG tablet   fluconazole (DIFLUCAN) 150 MG tablet       Return in about 4 months (around 10/17/2022) for A1C and BP check up .    Beatrice Lecher, MD

## 2022-06-18 NOTE — Assessment & Plan Note (Signed)
A1c up to 6.5 today.  She is now back on track with her eating.  But we also discussed getting back on track with exercise as well and then planning to recheck A1c in about 4 months.

## 2022-06-18 NOTE — Assessment & Plan Note (Signed)
Discussed goal of getting 80% of the blood sugars under 130 to be within guidelines.  She will try to monitor over the next couple of months.

## 2022-06-24 ENCOUNTER — Encounter: Payer: Self-pay | Admitting: Family Medicine

## 2022-06-24 ENCOUNTER — Ambulatory Visit (INDEPENDENT_AMBULATORY_CARE_PROVIDER_SITE_OTHER): Payer: 59 | Admitting: Family Medicine

## 2022-06-24 VITALS — BP 133/75 | HR 77 | Temp 98.4°F | Ht 68.0 in | Wt 190.0 lb

## 2022-06-24 DIAGNOSIS — B370 Candidal stomatitis: Secondary | ICD-10-CM

## 2022-06-24 DIAGNOSIS — J329 Chronic sinusitis, unspecified: Secondary | ICD-10-CM | POA: Diagnosis not present

## 2022-06-24 DIAGNOSIS — J40 Bronchitis, not specified as acute or chronic: Secondary | ICD-10-CM | POA: Diagnosis not present

## 2022-06-24 MED ORDER — NYSTATIN 100000 UNIT/ML MT SUSP: 5.0000 mL | Freq: Four times a day (QID) | OROMUCOSAL | 0 refills | Status: DC

## 2022-06-24 MED ORDER — AZITHROMYCIN 250 MG PO TABS: ORAL_TABLET | ORAL | 0 refills | Status: AC

## 2022-06-24 NOTE — Progress Notes (Signed)
Acute Office Visit  Subjective:     Patient ID: Nicole Braun, female    DOB: 07/28/1960, 62 y.o.   MRN: [id number]  Chief Complaint  Patient presents with   Cough    HPI Patient is in today for still not feeling well.  I saw her about a week ago for strep throat.  She had some other upper respiratory symptoms at that time as well but tested negative for flu and COVID.  She says she started to feel better until last Thursday about 4 to 5 days ago and her cough suddenly got worse and started feeling tired and exhausted again.  Her throat is still sore.  She actually lost her sense of taste and smell for about 3 to 4 days that started on Thursday as well.  She did a home COVID test which was negative and even went to urgent care where they did a flu test and it was negative as well.  She actually ran a fever yesterday to 28 which is quite high for her she says her baseline is around 97.  She just does not feel well.  ROS      Objective:    BP 133/75   Pulse 77   Temp 98.4 F (36.9 C) (Oral)   Ht '5\' 8"'$  (1.727 m)   Wt 190 lb (86.2 kg)   LMP 04/10/2011   SpO2 99%   BMI 28.89 kg/m    Physical Exam Constitutional:      Appearance: She is well-developed.  HENT:     Head: Normocephalic and atraumatic.     Right Ear: Tympanic membrane, ear canal and external ear normal.     Left Ear: External ear normal.     Ears:     Comments: Left TM blocked by cerumen.      Nose: Nose normal.     Comments: Tongue with thick white coating./     Mouth/Throat:     Pharynx: Oropharynx is clear. No oropharyngeal exudate.     Comments: Mild erythema Eyes:     Conjunctiva/sclera: Conjunctivae normal.     Pupils: Pupils are equal, round, and reactive to light.  Neck:     Thyroid: No thyromegaly.  Cardiovascular:     Rate and Rhythm: Normal rate and regular rhythm.     Heart sounds: Normal heart sounds.  Pulmonary:     Effort: Pulmonary effort is normal.     Breath sounds: Normal  breath sounds. No wheezing.  Musculoskeletal:     Cervical back: Neck supple.  Lymphadenopathy:     Cervical: No cervical adenopathy.  Skin:    General: Skin is warm and dry.  Neurological:     Mental Status: She is alert and oriented to person, place, and time.     No results found for any visits on 06/24/22.      Assessment & Plan:   Problem List Items Addressed This Visit   None Visit Diagnoses     Sinobronchitis    -  Primary   Relevant Medications   azithromycin (ZITHROMAX) 250 MG tablet   Thrush       Relevant Medications   azithromycin (ZITHROMAX) 250 MG tablet   nystatin (MYCOSTATIN) 100000 UNIT/ML suspension       Tested negative for COVID, flu and has been on therapy for strep throat for almost 7 days at this point.  Encouraged her to make sure she completes the penicillin.  Organ to add azithromycin and also  send over nystatin swish and swallow.  Simple thrush-versus hypertrophy of the villi on the tongue from the antibiotic.  Will treat with nystatin swish and swallow.  Meds ordered this encounter  Medications   azithromycin (ZITHROMAX) 250 MG tablet    Sig: 2 Ttabs PO on Day 1, then one a day x 4 days.    Dispense:  6 tablet    Refill:  0   nystatin (MYCOSTATIN) 100000 UNIT/ML suspension    Sig: Take 5 mLs (500,000 Units total) by mouth 4 (four) times daily. X 7days    Dispense:  473 mL    Refill:  0    No follow-ups on file.  Beatrice Lecher, MD

## 2022-06-25 ENCOUNTER — Encounter: Payer: Self-pay | Admitting: Family Medicine

## 2022-06-26 MED ORDER — OSELTAMIVIR PHOSPHATE 75 MG PO CAPS: 75.0000 mg | ORAL_CAPSULE | Freq: Every day | ORAL | 0 refills | Status: DC

## 2022-08-09 ENCOUNTER — Encounter: Payer: Self-pay | Admitting: Podiatry

## 2022-08-09 ENCOUNTER — Ambulatory Visit: Payer: 59 | Admitting: Podiatry

## 2022-08-09 DIAGNOSIS — D3613 Benign neoplasm of peripheral nerves and autonomic nervous system of lower limb, including hip: Secondary | ICD-10-CM | POA: Diagnosis not present

## 2022-08-09 DIAGNOSIS — M79671 Pain in right foot: Secondary | ICD-10-CM | POA: Diagnosis not present

## 2022-08-09 MED ORDER — DEXAMETHASONE SODIUM PHOSPHATE 120 MG/30ML IJ SOLN
4.0000 mg | Freq: Once | INTRAMUSCULAR | Status: AC
  Administered 2022-08-09: 4 mg via INTRA_ARTICULAR

## 2022-08-09 NOTE — Progress Notes (Signed)
  Subjective:  Patient ID: Nicole Braun, female    DOB: August 05, 1960,   MRN: [id number]  Chief Complaint  Patient presents with   Neuroma    Right foot neuroma patient is requesting an injection     62 y.o. female presents for follow-up of right foot neuroma. Relates injection helped for a while the last visit and here to get another in the right foot.   Denies any other pedal complaints. Denies n/v/f/c.   Past Medical History:  Diagnosis Date   Allergy    seasonal   Hypertension     Objective:  Physical Exam: Vascular: DP/PT pulses 2/4 bilateral. CFT <3 seconds. Normal hair growth on digits. No edema.  Skin. No lacerations or abrasions bilateral feet.  Musculoskeletal: MMT 5/5 bilateral lower extremities in DF, PF, Inversion and Eversion. Deceased ROM in DF of ankle joint. Tender over right second interspace and pain with compression. Positive mulders click.  Neurological: Sensation intact to light touch.   Assessment:   1. Neuroma of right lower extremity   2. Right foot pain      Plan:  Patient was evaluated and treated and all questions answered. Discussed neuroma and treatment options with patient.  Radiographs reviewed from previous.  Injection offered today. Patient in agreement. Procedure below.  Discussed padding and offloading today.  Patient to return as needed for future injections. If no relieve discussed MRI and possible surgery vs alcohol sclerosing injections.   Procedure: Injection Tendon/Ligament Discussed alternatives, risks, complications and verbal consent was obtained.  Location: Right second interspace. Skin Prep: Alcohol. Injectate: 1cc Kenalog 10 , 1 cc 2% lidocaine plain.  Disposition: Patient tolerated procedure well. Injection site dressed with a band-aid.  Post-injection care was discussed and return precautions discussed.    Lorenda Peck, DPM

## 2022-08-27 ENCOUNTER — Encounter: Payer: Self-pay | Admitting: Family Medicine

## 2022-08-27 DIAGNOSIS — Z0100 Encounter for examination of eyes and vision without abnormal findings: Secondary | ICD-10-CM

## 2022-09-04 DIAGNOSIS — Z124 Encounter for screening for malignant neoplasm of cervix: Secondary | ICD-10-CM | POA: Diagnosis not present

## 2022-09-04 DIAGNOSIS — Z1331 Encounter for screening for depression: Secondary | ICD-10-CM | POA: Diagnosis not present

## 2022-09-04 DIAGNOSIS — Z1231 Encounter for screening mammogram for malignant neoplasm of breast: Secondary | ICD-10-CM | POA: Diagnosis not present

## 2022-09-04 DIAGNOSIS — Z01419 Encounter for gynecological examination (general) (routine) without abnormal findings: Secondary | ICD-10-CM | POA: Diagnosis not present

## 2022-09-04 LAB — HM MAMMOGRAPHY

## 2022-09-05 LAB — HM PAP SMEAR: HM Pap smear: NEGATIVE

## 2022-09-13 ENCOUNTER — Encounter: Payer: Self-pay | Admitting: Family Medicine

## 2022-10-04 ENCOUNTER — Encounter: Payer: Self-pay | Admitting: Family Medicine

## 2022-10-17 ENCOUNTER — Ambulatory Visit: Payer: 59 | Admitting: Family Medicine

## 2022-10-17 ENCOUNTER — Encounter: Payer: Self-pay | Admitting: Family Medicine

## 2022-10-17 VITALS — BP 127/70 | HR 54 | Ht 68.0 in | Wt 184.0 lb

## 2022-10-17 DIAGNOSIS — E785 Hyperlipidemia, unspecified: Secondary | ICD-10-CM | POA: Diagnosis not present

## 2022-10-17 DIAGNOSIS — I10 Essential (primary) hypertension: Secondary | ICD-10-CM | POA: Diagnosis not present

## 2022-10-17 DIAGNOSIS — R7301 Impaired fasting glucose: Secondary | ICD-10-CM

## 2022-10-17 LAB — POCT GLYCOSYLATED HEMOGLOBIN (HGB A1C): Hemoglobin A1C: 5.5 % (ref 4.0–5.6)

## 2022-10-17 MED ORDER — LISINOPRIL-HYDROCHLOROTHIAZIDE 10-12.5 MG PO TABS: 1.0000 | ORAL_TABLET | Freq: Every day | ORAL | 1 refills | Status: DC

## 2022-10-17 NOTE — Assessment & Plan Note (Signed)
Due to recheck

## 2022-10-17 NOTE — Assessment & Plan Note (Signed)
Well controlled. Continue current regimen. Follow up in  6 mo  

## 2022-10-17 NOTE — Progress Notes (Signed)
   Established Patient Office Visit  Subjective   Patient ID: Nicole Braun, female    DOB: [birth year]  Age: 62 y.o. MRN: [id number]  Chief Complaint  Patient presents with   ifg   Hypertension    HPI  Impaired fasting glucose-no increased thirst or urination. No symptoms consistent with hypoglycemia.  Hypertension- Pt denies chest pain, SOB, dizziness, or heart palpitations.  Taking meds as directed w/o problems.  Denies medication side effects.       ROS    Objective:     BP 127/70   Pulse (!) 54   Ht 5\' 8"  (1.727 m)   Wt 184 lb (83.5 kg)   LMP 04/10/2011   SpO2 100%   BMI 27.98 kg/m    Physical Exam Vitals and nursing note reviewed.  Constitutional:      Appearance: She is well-developed.  HENT:     Head: Normocephalic and atraumatic.  Cardiovascular:     Rate and Rhythm: Normal rate and regular rhythm.     Heart sounds: Normal heart sounds.  Pulmonary:     Effort: Pulmonary effort is normal.     Breath sounds: Normal breath sounds.  Skin:    General: Skin is warm and dry.  Neurological:     Mental Status: She is alert and oriented to person, place, and time.  Psychiatric:        Behavior: Behavior normal.      Results for orders placed or performed in visit on 10/17/22  POCT glycosylated hemoglobin (Hb A1C)  Result Value Ref Range   Hemoglobin A1C 5.5 4.0 - 5.6 %   HbA1c POC (<> result, manual entry)     HbA1c, POC (prediabetic range)     HbA1c, POC (controlled diabetic range)        The 10-year ASCVD risk score (Arnett DK, et al., 2019) is: 5.9%    Assessment & Plan:   Problem List Items Addressed This Visit       Cardiovascular and Mediastinum   HYPERTENSION, BENIGN    Well controlled. Continue current regimen. Follow up in  58mo       Relevant Medications   lisinopril-hydrochlorothiazide (ZESTORETIC) 10-12.5 MG tablet   Other Relevant Orders   COMPLETE METABOLIC PANEL WITH GFR   CBC with Differential/Platelet   Lipid  Panel w/reflex Direct LDL     Endocrine   IFG (impaired fasting glucose) - Primary    A1C looks phenomenal today!!!!! Recheck in 6 mo       Relevant Orders   POCT glycosylated hemoglobin (Hb A1C) (Completed)   COMPLETE METABOLIC PANEL WITH GFR   CBC with Differential/Platelet   Lipid Panel w/reflex Direct LDL     Other   Hyperlipidemia    Due to recheck       Relevant Medications   lisinopril-hydrochlorothiazide (ZESTORETIC) 10-12.5 MG tablet   Other Relevant Orders   COMPLETE METABOLIC PANEL WITH GFR   CBC with Differential/Platelet   Lipid Panel w/reflex Direct LDL   Declines shingles vaccine.    Return in about 6 months (around 04/25/2023) for Hypertension.    Nani Gasser, MD

## 2022-10-17 NOTE — Assessment & Plan Note (Signed)
A1C looks phenomenal today!!!!! Recheck in 6 mo

## 2022-11-26 DIAGNOSIS — M79642 Pain in left hand: Secondary | ICD-10-CM | POA: Diagnosis not present

## 2022-11-26 DIAGNOSIS — M79641 Pain in right hand: Secondary | ICD-10-CM | POA: Diagnosis not present

## 2023-01-16 ENCOUNTER — Other Ambulatory Visit: Payer: Self-pay

## 2023-01-16 DIAGNOSIS — R7301 Impaired fasting glucose: Secondary | ICD-10-CM

## 2023-01-16 DIAGNOSIS — I10 Essential (primary) hypertension: Secondary | ICD-10-CM | POA: Diagnosis not present

## 2023-01-16 DIAGNOSIS — E785 Hyperlipidemia, unspecified: Secondary | ICD-10-CM

## 2023-01-17 LAB — CMP14+EGFR
ALT: 9 IU/L (ref 0–32)
AST: 11 IU/L (ref 0–40)
Albumin: 4.4 g/dL (ref 3.8–4.9)
Alkaline Phosphatase: 70 IU/L (ref 44–121)
BUN/Creatinine Ratio: 31 — ABNORMAL HIGH (ref 12–28)
BUN: 22 mg/dL (ref 8–27)
Bilirubin Total: 0.5 mg/dL (ref 0.0–1.2)
CO2: 26 mmol/L (ref 20–29)
Calcium: 9.5 mg/dL (ref 8.7–10.3)
Chloride: 100 mmol/L (ref 96–106)
Creatinine, Ser: 0.72 mg/dL (ref 0.57–1.00)
Globulin, Total: 2.4 g/dL (ref 1.5–4.5)
Glucose: 103 mg/dL — ABNORMAL HIGH (ref 70–99)
Potassium: 3.9 mmol/L (ref 3.5–5.2)
Sodium: 141 mmol/L (ref 134–144)
Total Protein: 6.8 g/dL (ref 6.0–8.5)
eGFR: 96 mL/min/{1.73_m2} (ref >59)

## 2023-01-17 LAB — CBC WITH DIFFERENTIAL/PLATELET
Basophils Absolute: 0.1 10*3/uL (ref 0.0–0.2)
Basos: 2 %
EOS (ABSOLUTE): 0 10*3/uL (ref 0.0–0.4)
Eos: 1 %
Hematocrit: 38.4 % (ref 34.0–46.6)
Hemoglobin: 13 g/dL (ref 11.1–15.9)
Immature Grans (Abs): 0 10*3/uL (ref 0.0–0.1)
Immature Granulocytes: 0 %
Lymphocytes Absolute: 1.2 10*3/uL (ref 0.7–3.1)
Lymphs: 31 %
MCH: 31 pg (ref 26.6–33.0)
MCHC: 33.9 g/dL (ref 31.5–35.7)
MCV: 91 fL (ref 79–97)
Monocytes Absolute: 0.3 10*3/uL (ref 0.1–0.9)
Monocytes: 7 %
Neutrophils Absolute: 2.2 10*3/uL (ref 1.4–7.0)
Neutrophils: 59 %
Platelets: 222 10*3/uL (ref 150–450)
RBC: 4.2 x10E6/uL (ref 3.77–5.28)
RDW: 11.1 % — ABNORMAL LOW (ref 11.7–15.4)
WBC: 3.8 10*3/uL (ref 3.4–10.8)

## 2023-01-17 LAB — LIPID PANEL
Chol/HDL Ratio: 3.1 ratio (ref 0.0–4.4)
Cholesterol, Total: 190 mg/dL (ref 100–199)
HDL: 62 mg/dL (ref >39)
LDL Chol Calc (NIH): 117 mg/dL — ABNORMAL HIGH (ref 0–99)
Triglycerides: 60 mg/dL (ref 0–149)
VLDL Cholesterol Cal: 11 mg/dL (ref 5–40)

## 2023-01-17 NOTE — Progress Notes (Signed)
Hi Nicole Braun, LDL cholesterol is over 100.  But your HDL looks much better than it has in the past which is fantastic.  And your triglycerides also look great.  Blood count metabolic panel overall looked good.    The 10-year ASCVD risk score (Arnett DK, et al., 2019) is: 3.9%   Values used to calculate the score:     Age: 62 years     Sex: Female     Is Non-Hispanic African American: No     Diabetic: No     Tobacco smoker: No     Systolic Blood Pressure: 127 mmHg     Is BP treated: Yes     HDL Cholesterol: 62 mg/dL     Total Cholesterol: 190 mg/dL

## 2023-04-21 ENCOUNTER — Ambulatory Visit: Payer: 59 | Admitting: Family Medicine

## 2023-05-16 ENCOUNTER — Ambulatory Visit: Payer: 59 | Admitting: Family Medicine

## 2023-05-16 ENCOUNTER — Encounter: Payer: Self-pay | Admitting: Family Medicine

## 2023-05-16 VITALS — BP 128/75 | HR 64 | Ht 68.0 in | Wt 171.0 lb

## 2023-05-16 DIAGNOSIS — R7301 Impaired fasting glucose: Secondary | ICD-10-CM

## 2023-05-16 DIAGNOSIS — I10 Essential (primary) hypertension: Secondary | ICD-10-CM | POA: Diagnosis not present

## 2023-05-16 LAB — POCT GLYCOSYLATED HEMOGLOBIN (HGB A1C): Hemoglobin A1C: 5.7 % — AB (ref 4.0–5.6)

## 2023-05-16 MED ORDER — LISINOPRIL-HYDROCHLOROTHIAZIDE 10-12.5 MG PO TABS
1.0000 | ORAL_TABLET | Freq: Every day | ORAL | 1 refills | Status: DC
Start: 2023-05-16 — End: 2023-10-28

## 2023-05-16 NOTE — Progress Notes (Signed)
   Established Patient Office Visit  Subjective  Patient ID: Nicole Braun, female    DOB: 1961-05-23  Age: 62 y.o. MRN: [id number]  Chief Complaint  Patient presents with   Hypertension    HPI  Hypertension- Pt denies chest pain, SOB, dizziness, or heart palpitations.  Taking meds as directed w/o problems.  Denies medication side effects.  Walking for exercise   Impaired fasting glucose-no increased thirst or urination. No symptoms consistent with hypoglycemia.  Sched for colonocopy 2/25     ROS    Objective:     BP 128/75   Pulse 64   Ht 5\' 8"  (1.727 m)   Wt 171 lb (77.6 kg)   LMP 04/10/2011   SpO2 100%   BMI 26.00 kg/m    Physical Exam Vitals and nursing note reviewed.  Constitutional:      Appearance: Normal appearance.  HENT:     Head: Normocephalic and atraumatic.  Eyes:     Conjunctiva/sclera: Conjunctivae normal.  Cardiovascular:     Rate and Rhythm: Normal rate and regular rhythm.  Pulmonary:     Effort: Pulmonary effort is normal.     Breath sounds: Normal breath sounds.  Skin:    General: Skin is warm and dry.  Neurological:     Mental Status: She is alert.  Psychiatric:        Mood and Affect: Mood normal.      Results for orders placed or performed in visit on 05/16/23  POCT HgB A1C  Result Value Ref Range   Hemoglobin A1C 5.7 (A) 4.0 - 5.6 %   HbA1c POC (<> result, manual entry)     HbA1c, POC (prediabetic range)     HbA1c, POC (controlled diabetic range)        The 10-year ASCVD risk score (Arnett DK, et al., 2019) is: 3.9%    Assessment & Plan:   Problem List Items Addressed This Visit       Cardiovascular and Mediastinum   HYPERTENSION, Goal under 130 - Primary (Chronic)   Well controlled. Continue current regimen. Follow up in  84mo       Relevant Medications   lisinopril-hydrochlorothiazide (ZESTORETIC) 10-12.5 MG tablet   Other Relevant Orders   Basic Metabolic Panel (BMET)     Endocrine   IFG (impaired  fasting glucose)   Well controlled. Continue current regimen. Follow up in  6 mo       Relevant Orders   POCT HgB A1C (Completed)   Basic Metabolic Panel (BMET)    Return in about 6 months (around 11/14/2023) for Hypertension, Pre-diabetes.    Nani Gasser, MD

## 2023-05-16 NOTE — Assessment & Plan Note (Signed)
Well controlled. Continue current regimen. Follow up in  6 mo  

## 2023-05-17 LAB — BASIC METABOLIC PANEL
BUN/Creatinine Ratio: 22 (ref 12–28)
BUN: 16 mg/dL (ref 8–27)
CO2: 25 mmol/L (ref 20–29)
Calcium: 9.6 mg/dL (ref 8.7–10.3)
Chloride: 98 mmol/L (ref 96–106)
Creatinine, Ser: 0.74 mg/dL (ref 0.57–1.00)
Glucose: 83 mg/dL (ref 70–99)
Potassium: 4.1 mmol/L (ref 3.5–5.2)
Sodium: 137 mmol/L (ref 134–144)
eGFR: 93 mL/min/{1.73_m2} (ref >59)

## 2023-05-17 NOTE — Progress Notes (Signed)
Your lab work is within acceptable range and there are no concerning findings.   ?

## 2023-05-19 ENCOUNTER — Encounter: Payer: Self-pay | Admitting: Family Medicine

## 2023-05-19 MED ORDER — AZITHROMYCIN 250 MG PO TABS: ORAL_TABLET | ORAL | 0 refills | Status: AC

## 2023-07-14 DIAGNOSIS — Z1211 Encounter for screening for malignant neoplasm of colon: Secondary | ICD-10-CM | POA: Diagnosis not present

## 2023-07-14 DIAGNOSIS — K640 First degree hemorrhoids: Secondary | ICD-10-CM | POA: Diagnosis not present

## 2023-07-14 DIAGNOSIS — K5730 Diverticulosis of large intestine without perforation or abscess without bleeding: Secondary | ICD-10-CM | POA: Diagnosis not present

## 2023-07-14 DIAGNOSIS — K6389 Other specified diseases of intestine: Secondary | ICD-10-CM | POA: Diagnosis not present

## 2023-07-14 DIAGNOSIS — K621 Rectal polyp: Secondary | ICD-10-CM | POA: Diagnosis not present

## 2023-10-09 DIAGNOSIS — Z124 Encounter for screening for malignant neoplasm of cervix: Secondary | ICD-10-CM | POA: Diagnosis not present

## 2023-10-09 DIAGNOSIS — Z01419 Encounter for gynecological examination (general) (routine) without abnormal findings: Secondary | ICD-10-CM | POA: Diagnosis not present

## 2023-10-09 DIAGNOSIS — Z1231 Encounter for screening mammogram for malignant neoplasm of breast: Secondary | ICD-10-CM | POA: Diagnosis not present

## 2023-10-09 LAB — HM MAMMOGRAPHY

## 2023-10-14 LAB — HM PAP SMEAR

## 2023-10-28 ENCOUNTER — Other Ambulatory Visit: Payer: Self-pay | Admitting: Family Medicine

## 2023-10-28 DIAGNOSIS — I10 Essential (primary) hypertension: Secondary | ICD-10-CM

## 2023-11-18 ENCOUNTER — Encounter: Payer: Self-pay | Admitting: Family Medicine

## 2023-11-18 ENCOUNTER — Ambulatory Visit: Payer: 59 | Admitting: Family Medicine

## 2023-11-18 VITALS — BP 124/66 | HR 78 | Ht 68.0 in | Wt 169.0 lb

## 2023-11-18 DIAGNOSIS — R7301 Impaired fasting glucose: Secondary | ICD-10-CM

## 2023-11-18 DIAGNOSIS — I10 Essential (primary) hypertension: Secondary | ICD-10-CM

## 2023-11-18 DIAGNOSIS — E785 Hyperlipidemia, unspecified: Secondary | ICD-10-CM

## 2023-11-18 DIAGNOSIS — M2550 Pain in unspecified joint: Secondary | ICD-10-CM

## 2023-11-18 LAB — POCT GLYCOSYLATED HEMOGLOBIN (HGB A1C): Hemoglobin A1C: 5.3 % (ref 4.0–5.6)

## 2023-11-18 NOTE — Assessment & Plan Note (Signed)
 Due to recheck lipid panel.  A1c is well-controlled.  She has lost weight.  Not currently on a medication for cholesterol.

## 2023-11-18 NOTE — Assessment & Plan Note (Signed)
 A1C looks great at 5.3.

## 2023-11-18 NOTE — Assessment & Plan Note (Signed)
 OK to use her Tylenol arthritis BID. Also recommen retry the Taryn gel.  Also okay to try turmeric.

## 2023-11-18 NOTE — Progress Notes (Signed)
 Established Patient Office Visit  Subjective  Patient ID: Nicole Braun, female    DOB: November 12, 1960  Age: 63 y.o. MRN: [id number]  Chief Complaint  Patient presents with   Hypertension   ifg    HPI  Hypertension- Pt denies chest pain, SOB, dizziness, or heart palpitations.  Taking meds as directed w/o problems.  Denies medication side effects.  Walking 10,000 steps 2 to 3 days/week and 5 to 6 pounds in the other days.  Impaired fasting glucose-no increased thirst or urination. No symptoms consistent with hypoglycemia.  Pt broke her collar bone as a child and has noticed that it is more visible. No pain. She wondered if this is just because she has lost so much weight.    Also she reports that the arthritis in her hands has gotten worse and wanted to know if Chondroitin would help with this or does she need to see someone about this.     Her OB/GYN also recently started her on Estrace cream to help with some urinary frequency.    ROS    Objective:     BP 124/66   Pulse 78   Ht 5' 8 (1.727 m)   Wt 169 lb (76.7 kg)   LMP 04/10/2011   SpO2 98%   BMI 25.70 kg/m    Physical Exam Vitals and nursing note reviewed.  Constitutional:      Appearance: Normal appearance.  HENT:     Head: Normocephalic and atraumatic.   Eyes:     Conjunctiva/sclera: Conjunctivae normal.    Cardiovascular:     Rate and Rhythm: Normal rate and regular rhythm.  Pulmonary:     Effort: Pulmonary effort is normal.     Breath sounds: Normal breath sounds.   Musculoskeletal:     Comments: Right medial collarbone feels very prominent but it is hard like bone.   Skin:    General: Skin is warm and dry.   Neurological:     Mental Status: She is alert.   Psychiatric:        Mood and Affect: Mood normal.      Results for orders placed or performed in visit on 11/18/23  POCT HgB A1C  Result Value Ref Range   Hemoglobin A1C 5.3 4.0 - 5.6 %   HbA1c POC (<> result, manual entry)      HbA1c, POC (prediabetic range)     HbA1c, POC (controlled diabetic range)        The 10-year ASCVD risk score (Arnett DK, et al., 2019) is: 4.1%    Assessment & Plan:   Problem List Items Addressed This Visit       Cardiovascular and Mediastinum   HYPERTENSION, Goal under 130 - Primary (Chronic)   Well controlled. Continue current regimen. Follow up in  43mo.  Did encourage her to let me know if she is noticing any low blood pressures at home and we can always adjust her blood pressure pill.      Relevant Orders   CMP14+EGFR   Lipid panel   CBC     Endocrine   IFG (impaired fasting glucose)   A1C looks great at 5.3.        Relevant Orders   POCT HgB A1C (Completed)   CMP14+EGFR   Lipid panel   CBC     Other   Hyperlipidemia   Due to recheck lipid panel.  A1c is well-controlled.  She has lost weight.  Not currently on a medication for  cholesterol.      Relevant Orders   CMP14+EGFR   Lipid panel   CBC   Arthralgia   OK to use her Tylenol arthritis BID. Also recommen retry the Taryn gel.  Also okay to try turmeric.        She declined shingles vaccine today.  Did encourage her to consider getting the pneumonia vaccine updated. Return in about 6 months (around 05/19/2024) for Hypertension, Pre-diabetes.    Dorothyann Byars, MD

## 2023-11-18 NOTE — Assessment & Plan Note (Addendum)
 Well controlled. Continue current regimen. Follow up in  45mo.  Did encourage her to let me know if she is noticing any low blood pressures at home and we can always adjust her blood pressure pill.

## 2023-11-18 NOTE — Progress Notes (Signed)
 Pt broke her collar bone as a child and has noticed that it is more visible. She wondered if this is just because she has lost so much weight.   Also she reports that the arthritis in her hands has gotten worse and wanted to know if Chondroitin would help with this or does she need to see someone about this.

## 2024-02-06 DIAGNOSIS — R7301 Impaired fasting glucose: Secondary | ICD-10-CM | POA: Diagnosis not present

## 2024-02-06 DIAGNOSIS — E785 Hyperlipidemia, unspecified: Secondary | ICD-10-CM | POA: Diagnosis not present

## 2024-02-06 DIAGNOSIS — I10 Essential (primary) hypertension: Secondary | ICD-10-CM | POA: Diagnosis not present

## 2024-02-07 LAB — CMP14+EGFR
ALT: 8 IU/L (ref 0–32)
AST: 13 IU/L (ref 0–40)
Albumin: 4.4 g/dL (ref 3.9–4.9)
Alkaline Phosphatase: 73 IU/L (ref 44–121)
BUN/Creatinine Ratio: 22 (ref 12–28)
BUN: 19 mg/dL (ref 8–27)
Bilirubin Total: 0.8 mg/dL (ref 0.0–1.2)
CO2: 25 mmol/L (ref 20–29)
Calcium: 9.7 mg/dL (ref 8.7–10.3)
Chloride: 100 mmol/L (ref 96–106)
Creatinine, Ser: 0.88 mg/dL (ref 0.57–1.00)
Globulin, Total: 2 g/dL (ref 1.5–4.5)
Glucose: 99 mg/dL (ref 70–99)
Potassium: 4.2 mmol/L (ref 3.5–5.2)
Sodium: 140 mmol/L (ref 134–144)
Total Protein: 6.4 g/dL (ref 6.0–8.5)
eGFR: 75 mL/min/1.73 (ref >59)

## 2024-02-07 LAB — CBC
Hematocrit: 36.9 % (ref 34.0–46.6)
Hemoglobin: 12.2 g/dL (ref 11.1–15.9)
MCH: 31 pg (ref 26.6–33.0)
MCHC: 33.1 g/dL (ref 31.5–35.7)
MCV: 94 fL (ref 79–97)
Platelets: 200 x10E3/uL (ref 150–450)
RBC: 3.93 x10E6/uL (ref 3.77–5.28)
RDW: 11.3 % — ABNORMAL LOW (ref 11.7–15.4)
WBC: 4.7 x10E3/uL (ref 3.4–10.8)

## 2024-02-07 LAB — LIPID PANEL
Chol/HDL Ratio: 2.9 ratio (ref 0.0–4.4)
Cholesterol, Total: 170 mg/dL (ref 100–199)
HDL: 58 mg/dL (ref >39)
LDL Chol Calc (NIH): 100 mg/dL — ABNORMAL HIGH (ref 0–99)
Triglycerides: 61 mg/dL (ref 0–149)
VLDL Cholesterol Cal: 12 mg/dL (ref 5–40)

## 2024-02-11 ENCOUNTER — Ambulatory Visit: Payer: Self-pay | Admitting: Family Medicine

## 2024-02-11 NOTE — Progress Notes (Signed)
 Hi Kaisey, LDL cholesterol looks better this time compared to last year, great work!.  Blood count looks good overall no anemia.  And metabolic panel including kidney and liver function looks great.

## 2024-05-07 ENCOUNTER — Other Ambulatory Visit: Payer: Self-pay | Admitting: Family Medicine

## 2024-05-07 DIAGNOSIS — I10 Essential (primary) hypertension: Secondary | ICD-10-CM

## 2024-05-25 ENCOUNTER — Ambulatory Visit: Admitting: Family Medicine

## 2024-05-25 ENCOUNTER — Encounter: Payer: Self-pay | Admitting: Family Medicine

## 2024-05-25 VITALS — BP 135/89 | HR 59 | Ht 68.0 in

## 2024-05-25 DIAGNOSIS — M2550 Pain in unspecified joint: Secondary | ICD-10-CM | POA: Diagnosis not present

## 2024-05-25 DIAGNOSIS — M19049 Primary osteoarthritis, unspecified hand: Secondary | ICD-10-CM

## 2024-05-25 DIAGNOSIS — I10 Essential (primary) hypertension: Secondary | ICD-10-CM | POA: Diagnosis not present

## 2024-05-25 DIAGNOSIS — R7301 Impaired fasting glucose: Secondary | ICD-10-CM | POA: Diagnosis not present

## 2024-05-25 DIAGNOSIS — N3281 Overactive bladder: Secondary | ICD-10-CM | POA: Diagnosis not present

## 2024-05-25 LAB — POCT GLYCOSYLATED HEMOGLOBIN (HGB A1C): Hemoglobin A1C: 5.4 % (ref 4.0–5.6)

## 2024-05-25 MED ORDER — MELOXICAM 15 MG PO TABS: 15.0000 mg | ORAL_TABLET | Freq: Every day | ORAL | 1 refills | Status: AC

## 2024-05-25 NOTE — Assessment & Plan Note (Signed)
A1C looks great 

## 2024-05-25 NOTE — Assessment & Plan Note (Signed)
 Well controlled. Continue current regimen. Follow up in  6 mo

## 2024-05-25 NOTE — Progress Notes (Signed)
 "  Established Patient Office Visit  Patient ID: Nicole Braun, female    DOB: September 05, 1960  Age: 63 y.o. MRN: [id number] PCP: Alvan Dorothyann BIRCH, MD  Chief Complaint  Patient presents with   Hypertension   ifg    Subjective:     HPI  Discussed the use of AI scribe software for clinical note transcription with the patient, who gave verbal consent to proceed.  History of Present Illness Nicole Braun is a 63 year old female with arthritis who presents with worsening joint pain.  Polyarticular joint pain - Worsening joint pain involving hands, hips, arms, and knees - Pain was notably severe on Saturday, coinciding with a weather change; improved slightly on Sunday with rain - Daily hand pain, with one finger particularly sore and prone to catching, limiting ability to make a fist - Difficulty opening jars due to pain - Relief achieved by running hands under hot water and using a heating pad - Compression gloves ordered for symptomatic relief  Analgesic and supplement use - Naproxen, 2 tablets at bedtime, provides more relief than Tylenol - Tylenol used during the day as needed - Collagen and Cintra recently started for joint symptoms - Ongoing pain despite current regimen  Urinary frequency and urgency - Frequent urination approximately every two hours during the day and once or twice at night - Caffeine intake limited to one cup of coffee in the morning; avoids other caffeinated beverages - Drinks decaf tea and coffee in the evenings; uses flavored water powders without calories - No leakage with coughing or sneezing - Occasional urgency-related leakage if unable to reach the bathroom in time - Children have noticed frequent bathroom trips      ROS    Objective:     BP 135/89   Pulse (!) 59   Ht 5' 8 (1.727 m)   LMP 04/10/2011   SpO2 100%   BMI 25.70 kg/m     Physical Exam Vitals and nursing note reviewed.  Constitutional:      Appearance:  Normal appearance.  HENT:     Head: Normocephalic and atraumatic.  Eyes:     Conjunctiva/sclera: Conjunctivae normal.  Cardiovascular:     Rate and Rhythm: Normal rate and regular rhythm.  Pulmonary:     Effort: Pulmonary effort is normal.     Breath sounds: Normal breath sounds.  Skin:    General: Skin is warm and dry.  Neurological:     Mental Status: She is alert.  Psychiatric:        Mood and Affect: Mood normal.      Results for orders placed or performed in visit on 05/25/24  POCT HgB A1C  Result Value Ref Range   Hemoglobin A1C 5.4 4.0 - 5.6 %   HbA1c POC (<> result, manual entry)     HbA1c, POC (prediabetic range)     HbA1c, POC (controlled diabetic range)        The 10-year ASCVD risk score (Arnett DK, et al., 2019) is: 4.7%    Assessment & Plan:   Problem List Items Addressed This Visit       Cardiovascular and Mediastinum   HYPERTENSION, Goal under 130 (Chronic)   Well controlled. Continue current regimen. Follow up in  6 mo         Endocrine   IFG (impaired fasting glucose) - Primary   A1C looks great!!       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 Current management includes naproxen, Tylenol, collagen. Considering meloxicam as an alternative. Discussed potential referral for steroid injections if symptoms worsen. - Continue naproxen 2 tablets at bedtime as needed for pain. - Prescribed meloxicam 30 tablets for trial use, to be taken in the evening  (instead of naproxen)  - Use compression gloves and heat therapy for symptomatic relief. - Consider referral to sports medicine for steroid injections if symptoms worsen.  Overactive bladder with urinary urgency and frequency Urinary urgency and frequency with nocturia. Symptoms may be exacerbated by caffeine and artificial sweeteners. Discussed non-pharmacological interventions and potential for medication if symptoms worsen. - Collect urine sample to check for glucose or protein. - Implement timed voiding strategy, including voiding before leaving the house and at regular intervals during the day. - Eliminate artificial sweeteners from diet for 3-4 weeks. - Reduce morning coffee intake from 12 ounces to 8 ounces. - Consider pelvic physical therapy for bladder control and pelvic floor strengthening. - Kegel exercises.      Return in about 6 months (around 11/23/2024) for Hypertension, Pre-diabetes.    Dorothyann Byars, MD [facility] at [facility]   "

## 2024-07-01 ENCOUNTER — Other Ambulatory Visit: Payer: Self-pay | Admitting: Family Medicine

## 2024-07-01 DIAGNOSIS — M2550 Pain in unspecified joint: Secondary | ICD-10-CM

## 2024-11-23 ENCOUNTER — Ambulatory Visit: Admitting: Family Medicine
